# Patient Record
Sex: Female | Born: 1990 | Race: White | Hispanic: No | Marital: Single | Smoking: Never smoker
Health system: Southern US, Community
[De-identification: ages and names within clinical notes are randomized; demographics above are authoritative.]

## PROBLEM LIST (undated history)

## (undated) DIAGNOSIS — E119 Type 2 diabetes mellitus without complications: Secondary | ICD-10-CM

## (undated) DIAGNOSIS — M9110 Juvenile osteochondrosis of head of femur [Legg-Calve-Perthes], unspecified leg: Secondary | ICD-10-CM

## (undated) DIAGNOSIS — K219 Gastro-esophageal reflux disease without esophagitis: Secondary | ICD-10-CM

## (undated) HISTORY — DX: Type 2 diabetes mellitus without complications: E11.9

## (undated) HISTORY — DX: Juvenile osteochondrosis of head of femur (Legg-Calve-Perthes), unspecified leg: M91.10

## (undated) HISTORY — DX: Gastro-esophageal reflux disease without esophagitis: K21.9

---
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
## 2013-03-30 ENCOUNTER — Ambulatory Visit (INDEPENDENT_AMBULATORY_CARE_PROVIDER_SITE_OTHER): Payer: 59 | Admitting: Family Medicine

## 2013-03-30 ENCOUNTER — Encounter: Payer: Self-pay | Admitting: Family Medicine

## 2013-03-30 DIAGNOSIS — Z136 Encounter for screening for cardiovascular disorders: Secondary | ICD-10-CM

## 2013-03-30 LAB — LIPID PANEL
Cholesterol: 197 mg/dL (ref 0–200)
Triglycerides: 161 mg/dL — ABNORMAL HIGH (ref 0.0–149.0)

## 2013-03-30 LAB — COMPREHENSIVE METABOLIC PANEL
Albumin: 3.7 g/dL (ref 3.5–5.2)
BUN: 9 mg/dL (ref 6–23)
CO2: 23 mEq/L (ref 19–32)
Calcium: 8.7 mg/dL (ref 8.4–10.5)
GFR: 99.31 mL/min (ref >60.00)
Glucose, Bld: 115 mg/dL — ABNORMAL HIGH (ref 70–99)
Potassium: 4.2 mEq/L (ref 3.5–5.1)
Sodium: 136 mEq/L (ref 135–145)
Total Protein: 7.6 g/dL (ref 6.0–8.3)

## 2013-03-30 NOTE — Patient Instructions (Addendum)
Good to see you, Christina Cobb. After you go to the lab, please stop by to see Shirlee Limerick on your way out to set up your referral.

## 2013-03-30 NOTE — Progress Notes (Signed)
22 yo female new to me here to discuss obesity.  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 Since then, she continues to gain more and more weight.  She enjoys eating, loves sweets.  Lost 30 pounds once but cutting out sodas and sweets.  Does not like to exercise.    Has been doing her research and wants to get Roux En Y gastric bypass.  Adopted but no immediate family had known CAD or DM.  Patient Active Problem List   Diagnosis Date Noted  . Morbid obesity 03/30/2013   Past Medical History  Diagnosis Date  . Legg-Perthes disease    Past Surgical History  Procedure Laterality Date  . Hip surgery Left 1998    due to Legg Prethes Disease   History  Substance Use Topics  . Smoking status: Never Smoker   . Smokeless tobacco: Not on file  . Alcohol Use: Not on file   Family History  Problem Relation Age of Onset  . Adopted: Yes   No Known Allergies No current outpatient prescriptions on file prior to visit.   No current facility-administered medications on file prior to visit.   The PMH, PSH, Social History, Family History, Medications, and allergies have been reviewed in [facility], and have been updated if relevant.  ROS: See HPI  Physical exam: BP 112/78  Pulse 68  Temp(Src) 97.7 F (36.5 C)  Ht 5' 4.25" (1.632 m)  Wt 319 lb (144.697 kg)  BMI 54.33 kg/m2 Gen:  Morbidly obese, NAD Resp:  CTA bilaterally CVS:  RRR Ext:  No edema  Assessment and Plan: 1. Morbid obesity Deteriorated. Will check labs for further risk stratification. Refer to bariatric surgery (prefers [facility]). - Comprehensive metabolic panel - Lipid Panel - Hemoglobin A1c - Ambulatory referral to General Surgery  2. Screening for ischemic heart disease  - Lipid Panel

## 2013-04-15 ENCOUNTER — Telehealth: Payer: Self-pay | Admitting: *Deleted

## 2013-04-15 NOTE — Telephone Encounter (Signed)
Pt needs a letter of medical necessity and a form completed for bariatric surgery.  She doesn't have her weights for the past 5 years.  Forms are on your desk.

## 2013-04-15 NOTE — Telephone Encounter (Signed)
Letter written

## 2013-04-15 NOTE — Telephone Encounter (Signed)
Letter and forms given to pt's mother, copy sent for scanning.

## 2013-05-12 ENCOUNTER — Other Ambulatory Visit (INDEPENDENT_AMBULATORY_CARE_PROVIDER_SITE_OTHER): Payer: Self-pay

## 2013-05-12 ENCOUNTER — Ambulatory Visit (INDEPENDENT_AMBULATORY_CARE_PROVIDER_SITE_OTHER): Payer: Commercial Managed Care - PPO | Admitting: Surgery

## 2013-05-12 ENCOUNTER — Encounter (INDEPENDENT_AMBULATORY_CARE_PROVIDER_SITE_OTHER): Payer: Self-pay | Admitting: Surgery

## 2013-05-12 DIAGNOSIS — Z6841 Body Mass Index (BMI) 40.0 and over, adult: Secondary | ICD-10-CM

## 2013-05-12 NOTE — Progress Notes (Signed)
Chief Complaint:  Obesity   History of Present Illness:  Christina Cobb is an 22 y.o. female who comes in with her mother for consideration of lap gastric bypass.  Her obesity was made to really more acutely she was immobilized for the lLegg Prethes disease.  She's been on our seminars and is interested in a laparoscopic Roux-en-Y gastric bypass. I went over the procedure with her and her mother in some detail. To light to begin the workup for this because she started having some pain in her legs again and her orthopedic surgeon said that she must lose weight in order to be considered for any further surgery related to her Legg Prethes.  She has been followed by Dr. Ruthe Mannan.    Past Medical History  Diagnosis Date  . Legg-Perthes disease   . GERD (gastroesophageal reflux disease)     Past Surgical History  Procedure Laterality Date  . Hip surgery Left 1998    due to Legg Prethes Disease  . Tonsillectomy and adenoidectomy  1997    No current outpatient prescriptions on file.   No current facility-administered medications for this visit.   Review of patient's allergies indicates no known allergies. Family History  Problem Relation Age of Onset  . Adopted: Yes   Social History:   reports that she has never smoked. She has never used smokeless tobacco. She reports that she does not drink alcohol or use illicit drugs.   REVIEW OF SYSTEMS - PERTINENT POSITIVES ONLY: Negative for DVT. Has tried multiple weight loss attempts without sustaining a success  Physical Exam:   Blood pressure 136/92, pulse 84, temperature 98.2 F (36.8 C), temperature source Temporal, resp. rate 17, height 5' 4.25" (1.632 m), weight 329 lb 12.8 oz (149.596 kg). Body mass index is 56.17 kg/(m^2).  Gen:  WDWN white female NAD  Neurological: Alert and oriented to person, place, and time. Motor and sensory function is grossly intact  Head: Normocephalic and atraumatic.  Eyes: Conjunctivae are normal. Pupils  are equal, round, and reactive to light. No scleral icterus.  Neck: Normal range of motion. Neck supple. No tracheal deviation or thyromegaly present.  Cardiovascular:  SR without murmurs or gallops.  No carotid bruits Respiratory: Effort normal.  No respiratory distress. No chest wall tenderness. Breath sounds normal.  No wheezes, rales or rhonchi.  Abdomen:  Obese nontender. I described port placement to her GU: Musculoskeletal: Normal range of motion. Extremities are nontender. No cyanosis, edema or clubbing noted Lymphadenopathy: No cervical, preauricular, postauricular or axillary adenopathy is present Skin: Skin is warm and dry. No rash noted. No diaphoresis. No erythema. No pallor. Pscyh: Normal mood and affect. Behavior is normal. Judgment and thought content normal.   LABORATORY RESULTS: No results found for this or any previous visit (from the past 48 hour(s)).  RADIOLOGY RESULTS: No results found.  Problem List: Patient Active Problem List   Diagnosis Date Noted  . Morbid obesity 03/30/2013    Assessment & Plan: Morbid obesity BMI 56. We'll begin the workup journey for laparoscopic Roux-en-Y gastric bypass    Matt B. Daphine Deutscher, MD, [facility] [phone] beeper [phone]  05/12/2013 11:40 AM

## 2013-05-12 NOTE — Patient Instructions (Signed)

## 2013-05-14 ENCOUNTER — Other Ambulatory Visit (INDEPENDENT_AMBULATORY_CARE_PROVIDER_SITE_OTHER): Payer: 59

## 2013-05-14 LAB — CBC WITH DIFFERENTIAL/PLATELET
Eosinophils Absolute: 0.7 10*3/uL (ref 0.0–0.7)
Hemoglobin: 11.9 g/dL — ABNORMAL LOW (ref 12.0–15.0)
Lymphocytes Relative: 38 % (ref 12–46)
Lymphs Abs: 2.9 10*3/uL (ref 0.7–4.0)
MCH: 28.2 pg (ref 26.0–34.0)
Monocytes Relative: 8 % (ref 3–12)
Neutro Abs: 3.4 10*3/uL (ref 1.7–7.7)
Neutrophils Relative %: 45 % (ref 43–77)
Platelets: 387 10*3/uL (ref 150–400)
RBC: 4.22 MIL/uL (ref 3.87–5.11)
WBC: 7.7 10*3/uL (ref 4.0–10.5)

## 2013-05-15 LAB — TSH: TSH: 5.623 u[IU]/mL — ABNORMAL HIGH (ref 0.350–4.500)

## 2013-05-15 LAB — T4: T4, Total: 9.6 ug/dL (ref 5.0–12.5)

## 2013-05-15 LAB — LIPID PANEL: Cholesterol: 187 mg/dL (ref 0–200)

## 2013-05-15 LAB — HCG, SERUM, QUALITATIVE: Preg, Serum: NEGATIVE

## 2013-05-17 ENCOUNTER — Ambulatory Visit (HOSPITAL_COMMUNITY): Admission: RE | Admit: 2013-05-17 | Payer: 59 | Source: Ambulatory Visit | Admitting: Surgery

## 2013-05-17 LAB — H. PYLORI ANTIBODY, IGG: H Pylori IgG: <0.4 {ISR}

## 2013-05-17 SURGERY — BREATH TEST, FOR HELICOBACTER PYLORI

## 2013-06-01 ENCOUNTER — Ambulatory Visit: Payer: 59 | Admitting: Dietician

## 2013-06-14 ENCOUNTER — Ambulatory Visit (HOSPITAL_COMMUNITY): Admission: RE | Admit: 2013-06-14 | Payer: 59 | Source: Ambulatory Visit

## 2013-06-14 ENCOUNTER — Ambulatory Visit (HOSPITAL_COMMUNITY): Payer: 59

## 2013-08-12 ENCOUNTER — Other Ambulatory Visit: Payer: Self-pay

## 2014-01-31 ENCOUNTER — Encounter: Payer: Self-pay | Admitting: Internal Medicine

## 2014-01-31 ENCOUNTER — Ambulatory Visit (INDEPENDENT_AMBULATORY_CARE_PROVIDER_SITE_OTHER): Payer: 59 | Admitting: Internal Medicine

## 2014-01-31 VITALS — BP 126/90 | HR 71 | Temp 98.4°F

## 2014-01-31 DIAGNOSIS — H6092 Unspecified otitis externa, left ear: Secondary | ICD-10-CM

## 2014-01-31 DIAGNOSIS — J309 Allergic rhinitis, unspecified: Secondary | ICD-10-CM

## 2014-01-31 DIAGNOSIS — H60399 Other infective otitis externa, unspecified ear: Secondary | ICD-10-CM

## 2014-01-31 MED ORDER — CIPROFLOXACIN-DEXAMETHASONE 0.3-0.1 % OT SUSP: 4.0000 [drp] | Freq: Two times a day (BID) | OTIC | Status: DC

## 2014-01-31 MED ORDER — CETIRIZINE HCL 10 MG PO TABS: 10.0000 mg | ORAL_TABLET | Freq: Every day | ORAL | Status: DC

## 2014-01-31 NOTE — Progress Notes (Signed)
Pre visit review using our clinic review tool, if applicable. No additional management support is needed unless otherwise documented below in the visit note. 

## 2014-01-31 NOTE — Patient Instructions (Addendum)
Otitis Externa Otitis externa is a bacterial or fungal infection of the outer ear canal. This is the area from the eardrum to the outside of the ear. Otitis externa is sometimes called "swimmer's ear." CAUSES  Possible causes of infection include:  Swimming in dirty water.  Moisture remaining in the ear after swimming or bathing.  Mild injury (trauma) to the ear.  Objects stuck in the ear (foreign body).  Cuts or scrapes (abrasions) on the outside of the ear. SYMPTOMS  The first symptom of infection is often itching in the ear canal. Later signs and symptoms may include swelling and redness of the ear canal, ear pain, and yellowish-white fluid (pus) coming from the ear. The ear pain may be worse when pulling on the earlobe. DIAGNOSIS  Your caregiver will perform a physical exam. A sample of fluid may be taken from the ear and examined for bacteria or fungi. TREATMENT  Antibiotic ear drops are often given for 10 to 14 days. Treatment may also include pain medicine or corticosteroids to reduce itching and swelling. PREVENTION   Keep your ear dry. Use the corner of a towel to absorb water out of the ear canal after swimming or bathing.  Avoid scratching or putting objects inside your ear. This can damage the ear canal or remove the protective wax that lines the canal. This makes it easier for bacteria and fungi to grow.  Avoid swimming in lakes, polluted water, or poorly chlorinated pools.  You may use ear drops made of rubbing alcohol and vinegar after swimming. Combine equal parts of white vinegar and alcohol in a bottle. Put 3 or 4 drops into each ear after swimming. HOME CARE INSTRUCTIONS   Apply antibiotic ear drops to the ear canal as prescribed by your caregiver.  Only take over-the-counter or prescription medicines for pain, discomfort, or fever as directed by your caregiver.  If you have diabetes, follow any additional treatment instructions from your caregiver.  Keep all  follow-up appointments as directed by your caregiver. SEEK MEDICAL CARE IF:   You have a fever.  Your ear is still red, swollen, painful, or draining pus after 3 days.  Your redness, swelling, or pain gets worse.  You have a severe headache.  You have redness, swelling, pain, or tenderness in the area behind your ear. MAKE SURE YOU:   Understand these instructions.  Will watch your condition.  Will get help right away if you are not doing well or get worse. Document Released: 09/23/2005 Document Revised: 12/16/2011 Document Reviewed: 10/10/2011 Exit[employer] Patient Information 2014 [employer].  

## 2014-01-31 NOTE — Progress Notes (Signed)
Subjective:    Patient ID: Christina Cobb, female    DOB: 1991/06/20, 23 y.o.   MRN: [id number]  HPI  Pt presents to the clinic today with c/o left ear pain and headache. She reports this started 1 month ago. She describes the pain as sharp. It does not affect her hearing. She has noticed some white/bloody drainage our of her ear this am. She has tried OTC tylenol with minimal relief. She denies fever, chills or body aches.  She also reports that she is out of her zyrtec. She would like that refilled today.  Review of Systems      Past Medical History  Diagnosis Date  . Legg-Perthes disease   . GERD (gastroesophageal reflux disease)     No current outpatient prescriptions on file.   No current facility-administered medications for this visit.    No Known Allergies  Family History  Problem Relation Age of Onset  . Adopted: Yes    History   Social History  . Marital Status: Single    Spouse Name: N/A    Number of Children: N/A  . Years of Education: N/A   Occupational History  . Not on file.   Social History Main Topics  . Smoking status: Never Smoker   . Smokeless tobacco: Never Used  . Alcohol Use: No  . Drug Use: No  . Sexual Activity: Not on file   Other Topics Concern  . Not on file   Social History Narrative  . No narrative on file     Constitutional: Denies fever, malaise, fatigue, headache or abrupt weight changes.  HEENT: Pt reports ear pain. Denies eye pain, eye redness, ringing in the ears, wax buildup, runny nose, nasal congestion, bloody nose, or sore throat.    No other specific complaints in a complete review of systems (except as listed in HPI above).  Objective:   Physical Exam   BP 126/90  Pulse 71  Temp(Src) 98.4 F (36.9 C) (Oral)  SpO2 98% Wt Readings from Last 3 Encounters:  05/12/13 329 lb 12.8 oz (149.596 kg)  03/30/13 319 lb (144.697 kg)    General: Appears her stated age, obese but well developed, well nourished  in NAD. HEENT: Ears: Right ear, cerumen impaction. Left ear, bloody drainage along the ear canal, dull reflex of the TM, redness of the external ear and pain with palpation of the penna and tragus. Cardiovascular: Normal rate and rhythm. S1,S2 noted.  No murmur, rubs or gallops noted. No JVD or BLE edema. No carotid bruits noted. Pulmonary/Chest: Normal effort and positive vesicular breath sounds. No respiratory distress. No wheezes, rales or ronchi noted.    BMET    Component Value Date/Time   NA 136 03/30/2013 0820   K 4.2 03/30/2013 0820   CL 104 03/30/2013 0820   CO2 23 03/30/2013 0820   GLUCOSE 115* 03/30/2013 0820   BUN 9 03/30/2013 0820   CREATININE 0.8 03/30/2013 0820   CALCIUM 8.7 03/30/2013 0820    Lipid Panel     Component Value Date/Time   CHOL 187 05/12/2013 1157   TRIG 211* 05/12/2013 1157   HDL 25* 05/12/2013 1157   CHOLHDL 7.5 05/12/2013 1157   VLDL 42* 05/12/2013 1157   LDLCALC 120* 05/12/2013 1157    CBC    Component Value Date/Time   WBC 7.7 05/12/2013 1157   RBC 4.22 05/12/2013 1157   HGB 11.9* 05/12/2013 1157   HCT 35.4* 05/12/2013 1157   PLT 387 05/12/2013  1157   MCV 83.9 05/12/2013 1157   MCH 28.2 05/12/2013 1157   MCHC 33.6 05/12/2013 1157   RDW 15.6* 05/12/2013 1157   LYMPHSABS 2.9 05/12/2013 1157   MONOABS 0.6 05/12/2013 1157   EOSABS 0.7 05/12/2013 1157   BASOSABS 0.0 05/12/2013 1157    Hgb A1C Lab Results  Component Value Date   HGBA1C 6.1 03/30/2013        Assessment & Plan:   Otitis Externa, left ear:  eRx for Ciprodex- apply as directed Try ibuprofen instead of tylenol Avoid sticking anything inside the ear  Allergic Rhinitis:  eRx for zyrtec 10 mg daily  RTC as needed

## 2014-05-24 ENCOUNTER — Ambulatory Visit (INDEPENDENT_AMBULATORY_CARE_PROVIDER_SITE_OTHER): Payer: 59 | Admitting: Family Medicine

## 2014-05-24 ENCOUNTER — Encounter: Payer: Self-pay | Admitting: Family Medicine

## 2014-05-24 VITALS — BP 124/78 | HR 61 | Temp 97.7°F | Ht 64.0 in | Wt 352.8 lb

## 2014-05-24 DIAGNOSIS — N920 Excessive and frequent menstruation with regular cycle: Secondary | ICD-10-CM

## 2014-05-24 DIAGNOSIS — N921 Excessive and frequent menstruation with irregular cycle: Secondary | ICD-10-CM | POA: Insufficient documentation

## 2014-05-24 LAB — CBC WITH DIFFERENTIAL/PLATELET
BASOS ABS: 0 10*3/uL (ref 0.0–0.1)
Basophils Relative: 0.4 % (ref 0.0–3.0)
Eosinophils Absolute: 0.4 10*3/uL (ref 0.0–0.7)
Eosinophils Relative: 5.1 % — ABNORMAL HIGH (ref 0.0–5.0)
HEMATOCRIT: 32.8 % — AB (ref 36.0–46.0)
Hemoglobin: 11 g/dL — ABNORMAL LOW (ref 12.0–15.0)
LYMPHS ABS: 2.7 10*3/uL (ref 0.7–4.0)
LYMPHS PCT: 32.8 % (ref 12.0–46.0)
MCHC: 33.5 g/dL (ref 30.0–36.0)
MCV: 86 fl (ref 78.0–100.0)
MONOS PCT: 5.9 % (ref 3.0–12.0)
Monocytes Absolute: 0.5 10*3/uL (ref 0.1–1.0)
NEUTROS PCT: 55.8 % (ref 43.0–77.0)
Neutro Abs: 4.6 10*3/uL (ref 1.4–7.7)
PLATELETS: 378 10*3/uL (ref 150.0–400.0)
RBC: 3.81 Mil/uL — ABNORMAL LOW (ref 3.87–5.11)
RDW: 15 % (ref 11.5–15.5)
WBC: 8.2 10*3/uL (ref 4.0–10.5)

## 2014-05-24 MED ORDER — NORETHIN ACE-ETH ESTRAD-FE 1-20 MG-MCG(24) PO TABS: 1.0000 | ORAL_TABLET | Freq: Every day | ORAL | Status: DC

## 2014-05-24 NOTE — Progress Notes (Signed)
   Subjective:   Patient ID: Christina Cobb, female    DOB: Jun 27, 1991, 23 y.o.   MRN: [id number]  Christina LevelJessica F Dement is a pleasant 23 y.o. year old female who presents to clinic today with her mom with Dysmenorrhea  on 05/24/2014  HPI: Started her period when she was 23 yo.  Since then, periods have been irregular, heavy but only lasted 3 days.  She has been bleeding every day for past two months.  Very heavy- using several heavy pads per day. No dizziness when she stands.  Virginal.  +cramping- taking Ibuprofen which has not been very effective.  No current outpatient prescriptions on file prior to visit.   No current facility-administered medications on file prior to visit.    No Known Allergies  Past Medical History  Diagnosis Date  . Legg-Perthes disease   . GERD (gastroesophageal reflux disease)     Past Surgical History  Procedure Laterality Date  . Hip surgery Left 1998    due to Legg Prethes Disease  . Tonsillectomy and adenoidectomy  1997    Family History  Problem Relation Age of Onset  . Adopted: Yes    History   Social History  . Marital Status: Single    Spouse Name: N/A    Number of Children: N/A  . Years of Education: N/A   Occupational History  . Not on file.   Social History Main Topics  . Smoking status: Never Smoker   . Smokeless tobacco: Never Used  . Alcohol Use: No  . Drug Use: No  . Sexual Activity: Not on file   Other Topics Concern  . Not on file   Social History Narrative  . No narrative on file   The PMH, PSH, Social History, Family History, Medications, and allergies have been reviewed in [facility], and have been updated if relevant.   Review of Systems See HPI +nausea No vomiting +cramping +heavy periods No dizziness No CP or SOB    Objective:    BP 124/78  Pulse 61  Temp(Src) 97.7 F (36.5 C) (Oral)  Ht 5\' 4"  (1.626 m)  Wt 352 lb 12 oz (160.006 kg)  BMI 60.52 kg/m2  SpO2 97%   Physical Exam  Nursing note  and vitals reviewed. Skin: Skin is warm and dry.  Psychiatric: She has a normal mood and affect. Her behavior is normal. Judgment and thought content normal.          Assessment & Plan:   Menorrhagia with irregular cycle - Plan: CBC with Differential No Follow-up on file.

## 2014-05-24 NOTE — Patient Instructions (Signed)
Great to see you. Please keep me updated. I will call you with your lab results.  Take Loestrin daily.

## 2014-05-24 NOTE — Assessment & Plan Note (Signed)
New. >25 minutes spent in face to face time with patient, >50% spent in counselling or coordination of care Will start OCPs- Loestrin- explained increased risk of DVT given her BMI.  She is declining depo provera. Check CBC to rule out anemia. If symptoms persist, will order pelvic US to rule out fibroids/endometriosis or other uterine/ovarian pathology.

## 2014-05-24 NOTE — Progress Notes (Signed)
Pre visit review using our clinic review tool, if applicable. No additional management support is needed unless otherwise documented below in the visit note. 

## 2014-05-25 ENCOUNTER — Encounter: Payer: Self-pay | Admitting: *Deleted

## 2015-10-27 DIAGNOSIS — H52223 Regular astigmatism, bilateral: Secondary | ICD-10-CM | POA: Diagnosis not present

## 2015-10-27 DIAGNOSIS — H5213 Myopia, bilateral: Secondary | ICD-10-CM | POA: Diagnosis not present

## 2017-06-30 ENCOUNTER — Encounter: Payer: Self-pay | Admitting: Family Medicine

## 2017-06-30 ENCOUNTER — Ambulatory Visit (INDEPENDENT_AMBULATORY_CARE_PROVIDER_SITE_OTHER): Payer: 59 | Admitting: Family Medicine

## 2017-06-30 VITALS — BP 124/74 | HR 72 | Temp 97.9°F | Ht 64.0 in

## 2017-06-30 DIAGNOSIS — H6090 Unspecified otitis externa, unspecified ear: Secondary | ICD-10-CM | POA: Insufficient documentation

## 2017-06-30 DIAGNOSIS — H60392 Other infective otitis externa, left ear: Secondary | ICD-10-CM

## 2017-06-30 MED ORDER — NEOMYCIN-POLYMYXIN-HC 1 % OT SOLN: 3.0000 [drp] | Freq: Four times a day (QID) | OTIC | 0 refills | Status: DC

## 2017-06-30 MED FILL — NEO/POLYMYXIN/HC EAR SOLN: 3.5-10000-1 | 17 days supply | Qty: 10 | Fill #0

## 2017-06-30 NOTE — Assessment & Plan Note (Signed)
tx with neomycin-polymyxin hct 1%  3 drops in L ear four times daily while awake Warm compress Analgesics  Disc symptomatic care - see instructions on AVS  Update if not starting to improve in a week or if worsening

## 2017-06-30 NOTE — Progress Notes (Signed)
   Subjective:    Patient ID: Christina Cobb, female    DOB: November 21, 1990, 26 y.o.   MRN: [id number]  HPI Here for ear pain   Tends to get wax build up  Ear canal looked swollen  No drainage  Hurts pretty badly  No fever   No uri symptoms   No recent swimming or water in her ear   Patient Active Problem List   Diagnosis Date Noted  . Otitis externa 06/30/2017  . Menorrhagia with irregular cycle 05/24/2014  . Morbid obesity (HCC) 03/30/2013   Past Medical History:  Diagnosis Date  . GERD (gastroesophageal reflux disease)   . Legg-Perthes disease    Past Surgical History:  Procedure Laterality Date  . HIP SURGERY Left 1998   due to Legg Prethes Disease  . TONSILLECTOMY AND ADENOIDECTOMY  1997   Social History  Substance Use Topics  . Smoking status: Never Smoker  . Smokeless tobacco: Never Used  . Alcohol use No   Family History  Problem Relation Age of Onset  . Adopted: Yes   No Known Allergies No current outpatient prescriptions on file prior to visit.   No current facility-administered medications on file prior to visit.     Review of Systems  Constitutional: Negative for activity change, appetite change, fatigue, fever and unexpected weight change.  HENT: Positive for ear pain. Negative for congestion, ear discharge, facial swelling, rhinorrhea, sinus pressure and sore throat.   Eyes: Negative for pain, redness and visual disturbance.  Respiratory: Negative for cough, shortness of breath and wheezing.   Cardiovascular: Negative for chest pain and palpitations.  Gastrointestinal: Negative for abdominal pain.  Endocrine: Negative for polydipsia and polyuria.  Genitourinary: Negative for dysuria, frequency and urgency.  Musculoskeletal: Negative for arthralgias and myalgias.  Skin: Negative for pallor and rash.  Allergic/Immunologic: Negative for environmental allergies.  Neurological: Negative for dizziness, syncope and headaches.  Hematological: Negative  for adenopathy.  Psychiatric/Behavioral: Negative for decreased concentration and dysphoric mood. The patient is not nervous/anxious.        Objective:   Physical Exam  Constitutional: She appears well-developed and well-nourished. No distress.  HENT:  Head: Normocephalic and atraumatic.  Mouth/Throat: Oropharynx is clear and moist.  R ear- moderate cerumen impaction with nl hearing   L ear- swollen and injected canal/ no drainage/no cerumen  Hearing nl   Nares and throat are clear No sinus tenderness  Eyes: Pupils are equal, round, and reactive to light. Conjunctivae and EOM are normal. Right eye exhibits no discharge. Left eye exhibits no discharge. No scleral icterus.  Neck: Normal range of motion. Neck supple.  Cardiovascular: Normal rate and regular rhythm.   Pulmonary/Chest: Effort normal and breath sounds normal.  Lymphadenopathy:    She has no cervical adenopathy.  Skin: Skin is warm and dry. No rash noted. No pallor.  Psychiatric: Her affect is blunt.  Blunted affect  Mother had to do the speaking for her           Assessment & Plan:   Problem List Items Addressed This Visit      Nervous and Auditory   Otitis externa - Primary    tx with neomycin-polymyxin hct 1%  3 drops in L ear four times daily while awake Warm compress Analgesics  Disc symptomatic care - see instructions on AVS  Update if not starting to improve in a week or if worsening

## 2017-06-30 NOTE — Patient Instructions (Signed)
You have an external ear canal infection  Try a warm compress over ear if it helps  Tylenol or ibuprofen are ok for pain  Use the ear drops as directed  Update if not starting to improve in a week or if worsening

## 2017-10-01 DIAGNOSIS — H5213 Myopia, bilateral: Secondary | ICD-10-CM | POA: Diagnosis not present

## 2017-11-17 MED FILL — DUREZOL 0.05% EYE DROPS: 0.05 | 17 days supply | Qty: 5 | Fill #0

## 2017-11-17 MED FILL — GATIFLOXACIN 0.5% EYE DROPS: 0.5 | 8 days supply | Qty: 3 | Fill #0

## 2017-11-21 MED FILL — ZOLPIDEM TARTRATE 10 MG TAB: 10 | 5 days supply | Qty: 5 | Fill #0

## 2018-08-19 ENCOUNTER — Encounter: Payer: Self-pay | Admitting: Family Medicine

## 2018-08-19 ENCOUNTER — Ambulatory Visit: Payer: Self-pay | Admitting: Family Medicine

## 2018-08-19 VITALS — BP 118/76 | HR 101 | Temp 98.2°F | Ht 64.0 in | Wt 317.0 lb

## 2018-08-19 DIAGNOSIS — J029 Acute pharyngitis, unspecified: Secondary | ICD-10-CM

## 2018-08-19 DIAGNOSIS — J069 Acute upper respiratory infection, unspecified: Secondary | ICD-10-CM | POA: Insufficient documentation

## 2018-08-19 LAB — POCT RAPID STREP A (OFFICE): RAPID STREP A SCREEN: NEGATIVE

## 2018-08-19 MED ORDER — BENZONATATE 200 MG PO CAPS: 200.0000 mg | ORAL_CAPSULE | Freq: Three times a day (TID) | ORAL | 1 refills | Status: DC | PRN

## 2018-08-19 NOTE — Progress Notes (Signed)
Subjective:    Patient ID: Christina Cobb, female    DOB: 1991/02/13, 27 y.o.   MRN: [id number]  HPI 27 yo pt of Dr Dayton MartesAron here with uri symptoms   Wt Readings from Last 3 Encounters:  08/19/18 (!) 317 lb (143.8 kg)  05/24/14 (!) 352 lb 12 oz (160 kg)  05/12/13 (!) 329 lb 12.8 oz (149.6 kg)   Pulse is 101 Temp 98.2 now   Started with scratchy throat- quite sore today (that is getting worse)  No rash No swollen LN  Then cough -prod of yellow phlegm  No nasal symptoms  Non smoker  Then fever - 99.8 (low grade)  Taking mucinex otc Tylenol Cold eze   Results for orders placed or performed in visit on 08/19/18  POCT rapid strep A  Result Value Ref Range   Rapid Strep A Screen Negative Negative    Doing fairly well  In school for crime scene investigation and then wants to get masters Interested in homeland security   Patient Active Problem List   Diagnosis Date Noted  . URI with cough and congestion 08/19/2018  . Otitis externa 06/30/2017  . Menorrhagia with irregular cycle 05/24/2014  . Morbid obesity (HCC) 03/30/2013   Past Medical History:  Diagnosis Date  . GERD (gastroesophageal reflux disease)   . Legg-Perthes disease    Past Surgical History:  Procedure Laterality Date  . HIP SURGERY Left 1998   due to Legg Prethes Disease  . TONSILLECTOMY AND ADENOIDECTOMY  1997   Social History   Tobacco Use  . Smoking status: Never Smoker  . Smokeless tobacco: Never Used  Substance Use Topics  . Alcohol use: No  . Drug use: No   Family History  Adopted: Yes   No Known Allergies No current outpatient medications on file prior to visit.   No current facility-administered medications on file prior to visit.     Review of Systems  Constitutional: Positive for appetite change and fatigue. Negative for fever.  HENT: Positive for postnasal drip and sore throat. Negative for congestion, ear pain, rhinorrhea, sinus pressure, sneezing and voice change.   Eyes:  Negative for pain and discharge.  Respiratory: Positive for cough. Negative for chest tightness, shortness of breath, wheezing and stridor.   Cardiovascular: Negative for chest pain.  Gastrointestinal: Negative for diarrhea, nausea and vomiting.  Genitourinary: Negative for frequency, hematuria and urgency.  Musculoskeletal: Negative for arthralgias and myalgias.  Skin: Negative for rash.  Neurological: Positive for headaches. Negative for dizziness, weakness and light-headedness.  Psychiatric/Behavioral: Negative for confusion and dysphoric mood.       Objective:   Physical Exam  Constitutional: She appears well-developed and well-nourished. No distress.  HENT:  Head: Normocephalic and atraumatic.  Right Ear: External ear normal.  Left Ear: External ear normal.  Mouth/Throat: Oropharynx is clear and moist.  Nares are injected and congested (despite lack of nasal symptoms)  No sinus tenderness Clear rhinorrhea and post nasal drip  Mild post throat injection   Eyes: Pupils are equal, round, and reactive to light. Conjunctivae and EOM are normal. Right eye exhibits no discharge. Left eye exhibits no discharge.  Neck: Normal range of motion. Neck supple.  Cardiovascular: Normal rate and normal heart sounds.  Pulmonary/Chest: Effort normal and breath sounds normal. No stridor. No respiratory distress. She has no wheezes. She has no rales. She exhibits no tenderness.  Good air exch No rales /rhonchi No wheeze   Lymphadenopathy:    She has  no cervical adenopathy.  Neurological: She is alert.  Skin: Skin is warm and dry. No rash noted.  Psychiatric: She has a normal mood and affect.  Pleasant           Assessment & Plan:   Problem List Items Addressed This Visit      Respiratory   URI with cough and congestion - Primary    With low grade temp and significant ST RST neg  Reassuring exam  Disc symptomatic care - see instructions on AVS  mucinex dm or mucinex plain plus  delsym Analgesic Antihistamine Handout given  Sent in tessalon for addnl cough control -prn  Update if not starting to improve in a week or if worsening         Other Visit Diagnoses    Sore throat       Relevant Orders   POCT rapid strep A (Completed)

## 2018-08-19 NOTE — Patient Instructions (Addendum)
I recommend mucinex DM for cough over the counter (there is a "max" version that may work better  If you have plain mucinex (not DM)- you can add delsym cough medicine (that has DM in it)  Salt water gargles  Tylenol or ibuprofen for sore throat and fever  Lots of rest  Strep test is negative today - so I think this is an upper respiratory virus  You may develop runny/stuffy nose   An antihistamine may help cough and also nasal symptoms if you get them   Wash your hands frequently -you are contagious   Update if not starting to improve in a week or if worsening

## 2018-08-19 NOTE — Assessment & Plan Note (Signed)
With low grade temp and significant ST RST neg  Reassuring exam  Disc symptomatic care - see instructions on AVS  mucinex dm or mucinex plain plus delsym Analgesic Antihistamine Handout given  Sent in tessalon for addnl cough control -prn  Update if not starting to improve in a week or if worsening

## 2018-09-08 ENCOUNTER — Telehealth: Payer: Self-pay | Admitting: Family Medicine

## 2018-09-08 NOTE — Telephone Encounter (Signed)
Patient was feeling better with symptoms, but cough remained: 12/2 began with sore throat again, no fever, no headache or dizziness.  Still doesn't have insurance.  Please advise.  [facility]

## 2018-09-08 NOTE — Telephone Encounter (Signed)
Mother notified and she will keep doing all the OTC recommendation and update us if she doesn't improve

## 2018-09-08 NOTE — Telephone Encounter (Signed)
The cough is usually the last thing to get better-if she needs a refill of tessalon please send it  Also mucinex DM or delsym otc is helpful   If ST is severe or fever -please f/u  Tylenol or ibuprofen may help  ? If ST may be from the trauma of coughing  Salt water gargles and chloraseptic throat spray are also helpful  Watch for fever/ worse prod cough/facial pain /severe ST and keep me updated

## 2020-02-19 ENCOUNTER — Ambulatory Visit: Payer: Self-pay | Attending: Internal Medicine

## 2020-02-19 DIAGNOSIS — Z23 Encounter for immunization: Secondary | ICD-10-CM

## 2020-02-19 NOTE — Progress Notes (Signed)
   Covid-19 Vaccination Clinic  Name:  NYLEE BARBUTO    MRN: [id number] DOB: October 01, 1991  02/19/2020  Ms. Newcom was observed post Covid-19 immunization for 15 minutes without incident. She was provided with Vaccine Information Sheet and instruction to access the V-Safe system.   Ms. Cavanaugh was instructed to call 911 with any severe reactions post vaccine: Marland Kitchen Difficulty breathing  . Swelling of face and throat  . A fast heartbeat  . A bad rash all over body  . Dizziness and weakness   Immunizations Administered    Name Date Dose VIS Date Route   Pfizer COVID-19 Vaccine 02/19/2020  9:43 AM 0.3 mL 12/01/2018 Intramuscular   Manufacturer: [employer], [employer]   Lot: [id number]   NDC: [address]-7

## 2020-03-11 ENCOUNTER — Ambulatory Visit: Payer: Self-pay | Attending: Internal Medicine

## 2020-03-11 DIAGNOSIS — Z23 Encounter for immunization: Secondary | ICD-10-CM

## 2020-03-11 NOTE — Progress Notes (Signed)
   Covid-19 Vaccination Clinic  Name:  LAKEYN DOKKEN    MRN: [id number] DOB: 1991-08-14  03/11/2020  Ms. Blas was observed post Covid-19 immunization for 15 minutes without incident. She was provided with Vaccine Information Sheet and instruction to access the V-Safe system.   Ms. Fleagle was instructed to call 911 with any severe reactions post vaccine: Marland Kitchen Difficulty breathing  . Swelling of face and throat  . A fast heartbeat  . A bad rash all over body  . Dizziness and weakness   Immunizations Administered    Name Date Dose VIS Date Route   Pfizer COVID-19 Vaccine 03/11/2020  8:35 AM 0.3 mL 12/01/2018 Intramuscular   Manufacturer: [employer], [employer]   Lot: [id number]   NDC: [address]-0

## 2020-03-14 ENCOUNTER — Ambulatory Visit: Payer: Self-pay

## 2020-03-29 ENCOUNTER — Telehealth: Payer: Self-pay | Admitting: General Practice

## 2020-03-29 NOTE — Telephone Encounter (Signed)
Removed Dr. Aron as patient's PCP due to her leaving this practice and patient has not been seen by her in 6 years.   

## 2021-03-12 ENCOUNTER — Other Ambulatory Visit: Payer: Self-pay | Admitting: Family Medicine

## 2021-03-12 ENCOUNTER — Encounter: Payer: Self-pay | Admitting: Family Medicine

## 2021-03-12 ENCOUNTER — Telehealth (INDEPENDENT_AMBULATORY_CARE_PROVIDER_SITE_OTHER): Payer: BC Managed Care – PPO | Admitting: Family Medicine

## 2021-03-12 VITALS — HR 88 | Temp 99.8°F

## 2021-03-12 DIAGNOSIS — J069 Acute upper respiratory infection, unspecified: Secondary | ICD-10-CM

## 2021-03-12 DIAGNOSIS — J0110 Acute frontal sinusitis, unspecified: Secondary | ICD-10-CM

## 2021-03-12 DIAGNOSIS — J019 Acute sinusitis, unspecified: Secondary | ICD-10-CM | POA: Insufficient documentation

## 2021-03-12 MED ORDER — AMOXICILLIN-POT CLAVULANATE 875-125 MG PO TABS: 1.0000 | ORAL_TABLET | Freq: Two times a day (BID) | ORAL | 0 refills | Status: DC

## 2021-03-12 MED ORDER — PROMETHAZINE-DM 6.25-15 MG/5ML PO SYRP: 5.0000 mL | ORAL_SOLUTION | Freq: Four times a day (QID) | ORAL | 0 refills | Status: DC | PRN

## 2021-03-12 NOTE — Progress Notes (Signed)
Virtual Visit via Video Note  I connected with [name] on 03/12/21 at  4:15 PM EDT by a video enabled telemedicine application and verified that I am speaking with the correct person using two identifiers.  Location: Patient: home Provider: office   I discussed the limitations of evaluation and management by telemedicine and the availability of in person appointments. The patient expressed understanding and agreed to proceed.  Parties involved in encounter  Patient: Christina Cobb  Provider:  Roxy Manns MD   History of Present Illness: Pt presents for uri symptoms -needing work note   Started Friday evening with ear pain L  Then sinus pain and sore throat  Congestion   A little cough- just from drainage since Saturday  Then fever as well (went up to 101.2 several times  Some body aches Chills once   Green nasal d/c -nasty color  Sinus pain all around eyes  Also her upper jaw/teeth  L ear still hurts too (used some otc drops)    No wheezing or sob  No n/v/d   She ordered a test from [facility] - it was negative   Temp 99.8   otc mucinex DM Zyrtec   Is immunized for covid, no booster   Patient Active Problem List   Diagnosis Date Noted  . URI with cough and congestion 08/19/2018  . Menorrhagia with irregular cycle 05/24/2014  . Morbid obesity (HCC) 03/30/2013   Past Medical History:  Diagnosis Date  . GERD (gastroesophageal reflux disease)   . Legg-Perthes disease    Past Surgical History:  Procedure Laterality Date  . HIP SURGERY Left 1998   due to Legg Prethes Disease  . TONSILLECTOMY AND ADENOIDECTOMY  1997   Social History   Tobacco Use  . Smoking status: Never Smoker  . Smokeless tobacco: Never Used  Substance Use Topics  . Alcohol use: No  . Drug use: No   Family History  Adopted: Yes   No Known Allergies No current outpatient medications on file prior to visit.   No current facility-administered medications on file prior to  visit.   Review of Systems  Constitutional: Positive for fever and malaise/fatigue. Negative for chills.  HENT: Positive for congestion, ear pain, sinus pain and sore throat.   Eyes: Negative for blurred vision, discharge and redness.  Respiratory: Positive for cough. Negative for sputum production, shortness of breath, wheezing and stridor.   Cardiovascular: Negative for chest pain, palpitations and leg swelling.  Gastrointestinal: Negative for abdominal pain, diarrhea, nausea and vomiting.  Musculoskeletal: Negative for myalgias.  Skin: Negative for rash.  Neurological: Positive for headaches. Negative for dizziness.    Observations/Objective: Patient appears well, in no distress Weight is baseline  No facial swelling or asymmetry Voice is hoarse No obvious tremor or mobility impairment Moving neck and UEs normally Able to hear the call well  Audible occ dry cough No audible wheeze or sob  Talkative and mentally sharp with no cognitive changes No skin changes on face or neck , no rash or pallor Affect is normal    Assessment and Plan: Problem List Items Addressed This Visit      Respiratory   URI with cough and congestion - Primary    Home covid test negative PCR test ordered to confirm, along with flu test  Disc symptom control  Tylenol prn pain/fever prometh-dm called in for cough  Rest/fluids Watch closely for sob or wheeze ER parameters disc  Work note-to be out until test results  return /symptoms are better      Acute sinusitis    With viral uri  Purulent sinus drainage and facial pain/L ear pain that is worsening  Will tx with augmentin  Consider prednisone if no improvement  Update if not starting to improve in a week or if worsening         Relevant Medications   amoxicillin-clavulanate (AUGMENTIN) 875-125 MG tablet   promethazine-dextromethorphan (PROMETHAZINE-DM) 6.25-15 MG/5ML syrup      Follow Up Instructions: Drink fluids and rest  Try the  promethazine DM for cough and congestion  Nasal saline for congestion as needed  Tylenol for fever or pain or headache  Take augmentin for sinus infection  Please alert [place] if symptoms worsen (if severe or short of breath please go to the ER)   The office will call to set up testing for covid and flu  Continue to isolate until we get test results and you feel better    I discussed the assessment and treatment plan with the patient. The patient was provided an opportunity to ask questions and all were answered. The patient agreed with the plan and demonstrated an understanding of the instructions.   The patient was advised to call back or seek an in-person evaluation if the symptoms worsen or if the condition fails to improve as anticipated.    Roxy Manns, MD

## 2021-03-12 NOTE — Patient Instructions (Signed)
Drink fluids and rest  Try the promethazine DM for cough and congestion  Nasal saline for congestion as needed  Tylenol for fever or pain or headache  Take augmentin for sinus infection  Please alert [place] if symptoms worsen (if severe or short of breath please go to the ER)   The office will call to set up testing for covid and flu  Continue to isolate until we get test results and you feel better

## 2021-03-12 NOTE — Assessment & Plan Note (Signed)
Home covid test negative PCR test ordered to confirm, along with flu test  Disc symptom control  Tylenol prn pain/fever prometh-dm called in for cough  Rest/fluids Watch closely for sob or wheeze ER parameters disc  Work note-to be out until test results return /symptoms are better

## 2021-03-12 NOTE — Assessment & Plan Note (Signed)
With viral uri  Purulent sinus drainage and facial pain/L ear pain that is worsening  Will tx with augmentin  Consider prednisone if no improvement  Update if not starting to improve in a week or if worsening

## 2021-03-13 ENCOUNTER — Other Ambulatory Visit (INDEPENDENT_AMBULATORY_CARE_PROVIDER_SITE_OTHER): Payer: BC Managed Care – PPO

## 2021-03-13 DIAGNOSIS — J069 Acute upper respiratory infection, unspecified: Secondary | ICD-10-CM | POA: Diagnosis not present

## 2021-03-13 LAB — POC INFLUENZA A&B (BINAX/QUICKVUE)
Influenza A, POC: NEGATIVE
Influenza B, POC: NEGATIVE

## 2021-03-14 ENCOUNTER — Telehealth: Payer: Self-pay | Admitting: Family Medicine

## 2021-03-14 LAB — SARS-COV-2, NAA 2 DAY TAT

## 2021-03-14 LAB — NOVEL CORONAVIRUS, NAA: SARS-CoV-2, NAA: NOT DETECTED

## 2021-03-14 NOTE — Telephone Encounter (Signed)
Work note

## 2021-03-21 ENCOUNTER — Telehealth: Payer: Self-pay | Admitting: *Deleted

## 2021-03-21 MED ORDER — FLUCONAZOLE 150 MG PO TABS: ORAL_TABLET | ORAL | 0 refills | Status: DC

## 2021-03-21 NOTE — Telephone Encounter (Signed)
I sent it  F/u if no improvement  

## 2021-03-21 NOTE — Telephone Encounter (Signed)
Mother Artelia Laroche) asked that I send Dr. Milinda Antis a message. Pt has completed abx and is feeling better but has developed a yeast inf. She doesn't have any vaginal discharge that she can see but she is having increased vaginal and perineal itching that is getting worse.   Mother request 2 pills of diflucan because sometimes pt has to take the 2nd dose 3 days later to completely clear up yeast inf.  [facility] [address]

## 2021-03-21 NOTE — Telephone Encounter (Signed)
Pt's mother notified Rx sent

## 2021-03-29 ENCOUNTER — Telehealth: Payer: Self-pay

## 2021-03-29 NOTE — Telephone Encounter (Signed)
Done and in IN box 

## 2021-03-29 NOTE — Telephone Encounter (Signed)
Semi completed FMLA paperwork placed in Dr. Royden Purl inbox for review, completion, sign and date

## 2021-03-29 NOTE — Telephone Encounter (Signed)
Paperwork completed, signed and faxed  Copy for pt  Copy for scan   Copy retained by me

## 2021-08-06 ENCOUNTER — Encounter: Payer: Self-pay | Admitting: Internal Medicine

## 2021-08-06 ENCOUNTER — Telehealth (INDEPENDENT_AMBULATORY_CARE_PROVIDER_SITE_OTHER): Payer: BC Managed Care – PPO | Admitting: Internal Medicine

## 2021-08-06 ENCOUNTER — Other Ambulatory Visit: Payer: Self-pay

## 2021-08-06 DIAGNOSIS — J0110 Acute frontal sinusitis, unspecified: Secondary | ICD-10-CM | POA: Diagnosis not present

## 2021-08-06 MED ORDER — AMOXICILLIN-POT CLAVULANATE 875-125 MG PO TABS: 1.0000 | ORAL_TABLET | Freq: Two times a day (BID) | ORAL | 0 refills | Status: DC

## 2021-08-06 MED ORDER — PROMETHAZINE-DM 6.25-15 MG/5ML PO SYRP: 5.0000 mL | ORAL_SOLUTION | Freq: Four times a day (QID) | ORAL | 0 refills | Status: DC | PRN

## 2021-08-06 NOTE — Progress Notes (Signed)
   Subjective:    Patient ID: Christina Cobb, female    DOB: 18-Aug-1991, 30 y.o.   MRN: [id number]  HPI Video virtual visit for respiratory symptoms Identification done Reviewed limitations and billing and she gave consent Participants--- patient in her home (and mom RN in background helps a little) and I am in my office  Started ~10 days ago---then worsened 2 days later Sore throat, mild cough--head and then chest congestion --with SOB Some improvement   Went back to work 4 days ago---thought she was improving but then got SOB, worse cough and chest congestion Worked 2 more days but worsened Now with some ear pain--and sense of it closing up (right)  Taking the rest of the cough medication she had and robitussin Comes back when it wears off Green brown mucus from nose--and also when she coughs  Hasn't check oximetry Has had fever---highest was 101.2 Notes the SOB with a coughing fit----some SOB even with just sitting  Current Outpatient Medications on File Prior to Visit  Medication Sig Dispense Refill   promethazine-dextromethorphan (PROMETHAZINE-DM) 6.25-15 MG/5ML syrup Take 5 mLs by mouth 4 (four) times daily as needed for cough. Caution of sedation 118 mL 0   No current facility-administered medications on file prior to visit.    No Known Allergies  Past Medical History:  Diagnosis Date   GERD (gastroesophageal reflux disease)    Legg-Perthes disease     Past Surgical History:  Procedure Laterality Date   HIP SURGERY Left 1998   due to Legg Prethes Disease   TONSILLECTOMY AND ADENOIDECTOMY  1997    Family History  Adopted: Yes    Social History   Socioeconomic History   Marital status: Single    Spouse name: Not on file   Number of children: Not on file   Years of education: Not on file   Highest education level: Not on file  Occupational History   Not on file  Tobacco Use   Smoking status: Never   Smokeless tobacco: Never  Substance and Sexual  Activity   Alcohol use: No   Drug use: No   Sexual activity: Not on file  Other Topics Concern   Not on file  Social History Narrative   Not on file   Social Determinants of Health   Financial Resource Strain: Not on file  Food Insecurity: Not on file  Transportation Needs: Not on file  Physical Activity: Not on file  Stress: Not on file  Social Connections: Not on file  Intimate Partner Violence: Not on file   Review of Systems Some dizziness this morning---vertigo No headaches No N/V Eating okay--appetite is off Cough keeping her up Tested for COVID twice--both negative Lives with mom---she did miss work today due to congestion/scratchy throat     Objective:   Physical Exam Constitutional:      Appearance: Normal appearance.  Pulmonary:     Effort: Pulmonary effort is normal. No respiratory distress.     Comments: No distress or dyspnea sitting on her couch Neurological:     Mental Status: She is alert.           Assessment & Plan:

## 2021-08-06 NOTE — Assessment & Plan Note (Addendum)
Seems to have at least a sinus infection----I have some concern about the dyspnea Will try augmentin --and may need in person visit within 48 hours if not improving Will refill cough syrup Continue analgesics  May need FMLA papers for 4 days of missed work

## 2021-08-13 ENCOUNTER — Encounter: Payer: Self-pay | Admitting: Internal Medicine

## 2021-08-13 ENCOUNTER — Ambulatory Visit (INDEPENDENT_AMBULATORY_CARE_PROVIDER_SITE_OTHER): Payer: BC Managed Care – PPO | Admitting: Internal Medicine

## 2021-08-13 ENCOUNTER — Other Ambulatory Visit: Payer: Self-pay

## 2021-08-13 DIAGNOSIS — H6501 Acute serous otitis media, right ear: Secondary | ICD-10-CM | POA: Insufficient documentation

## 2021-08-13 MED ORDER — PREDNISONE 20 MG PO TABS: 40.0000 mg | ORAL_TABLET | Freq: Every day | ORAL | 0 refills | Status: DC

## 2021-08-13 NOTE — Progress Notes (Signed)
   Subjective:    Patient ID: Christina Cobb, female    DOB: [birth year], 30 y.o.   MRN: [id number]  HPI Here due to continued ear pain This visit occurred during the SARS-CoV-2 public health emergency.  Safety protocols were in place, including screening questions prior to the visit, additional usage of staff PPE, and extensive cleaning of exam room while observing appropriate contact time as indicated for disinfecting solutions.   Feels some better--1 dose left Still having a lot of right ear pain--and also her right throat Some sporadic cough--but is better "I feel like I am listening to a straw in that ear" No SOB  Current Outpatient Medications on File Prior to Visit  Medication Sig Dispense Refill   amoxicillin-clavulanate (AUGMENTIN) 875-125 MG tablet Take 1 tablet by mouth 2 (two) times daily. 14 tablet 0   promethazine-dextromethorphan (PROMETHAZINE-DM) 6.25-15 MG/5ML syrup Take 5 mLs by mouth 4 (four) times daily as needed for cough. Caution of sedation 118 mL 0   No current facility-administered medications on file prior to visit.    No Known Allergies  Past Medical History:  Diagnosis Date   GERD (gastroesophageal reflux disease)    Legg-Perthes disease     Past Surgical History:  Procedure Laterality Date   HIP SURGERY Left 1998   due to Legg Prethes Disease   TONSILLECTOMY AND ADENOIDECTOMY  1997    Family History  Adopted: Yes    Social History   Socioeconomic History   Marital status: Single    Spouse name: Not on file   Number of children: Not on file   Years of education: Not on file   Highest education level: Not on file  Occupational History   Not on file  Tobacco Use   Smoking status: Never   Smokeless tobacco: Never  Substance and Sexual Activity   Alcohol use: No   Drug use: No   Sexual activity: Not on file  Other Topics Concern   Not on file  Social History Narrative   Not on file   Social Determinants of Health   Financial  Resource Strain: Not on file  Food Insecurity: Not on file  Transportation Needs: Not on file  Physical Activity: Not on file  Stress: Not on file  Social Connections: Not on file  Intimate Partner Violence: Not on file   Review of Systems Uses pillow over right ear to ease pain---able to slee Appetite remains normal No N/V No diarrhea    Objective:   Physical Exam Constitutional:      Appearance: Normal appearance.  HENT:     Right Ear: Ear canal normal.     Left Ear: Ear canal normal.     Ears:     Comments: Left TM slightly retracted  No tragal tenderness on right TM slightly prominent (?fluid) No inflammation    Nose:     Comments: Mild pale nasal congestion    Mouth/Throat:     Pharynx: No oropharyngeal exudate or posterior oropharyngeal erythema.  Pulmonary:     Effort: Pulmonary effort is normal.     Breath sounds: Normal breath sounds. No wheezing or rales.  Musculoskeletal:     Cervical back: Neck supple.  Lymphadenopathy:     Cervical: No cervical adenopathy.  Neurological:     Mental Status: She is alert.           Assessment & Plan:

## 2021-08-13 NOTE — Assessment & Plan Note (Signed)
No redness but substantial pain Finishing the augmentin and mostly better overall Will try 3 days of prednisone FMLA papers done

## 2021-08-21 ENCOUNTER — Other Ambulatory Visit: Payer: Self-pay

## 2021-08-21 ENCOUNTER — Telehealth: Payer: Self-pay

## 2021-08-21 MED ORDER — DOXYCYCLINE HYCLATE 100 MG PO TABS
100.0000 mg | ORAL_TABLET | Freq: Two times a day (BID) | ORAL | 0 refills | Status: DC
  Filled 2021-08-21: qty 14, 7d supply, fill #0

## 2021-08-21 NOTE — Telephone Encounter (Signed)
Pt's mom called to say her symptoms have worsened in the last day: Right Ear Pain, sore throat, productive cough with yellow phlegm, and a negative covid test. Asking what she should do.

## 2021-08-21 NOTE — Telephone Encounter (Signed)
Patient's mom notified in person as instructed and she verbalized understanding.

## 2021-08-21 NOTE — Telephone Encounter (Signed)
Let them know I sent a different antibiotic to the [facility] pharmacy

## 2022-02-19 ENCOUNTER — Ambulatory Visit (INDEPENDENT_AMBULATORY_CARE_PROVIDER_SITE_OTHER)
Admission: RE | Admit: 2022-02-19 | Discharge: 2022-02-19 | Disposition: A | Discharge status: Home / Self Care | Payer: BC Managed Care – PPO | Source: Ambulatory Visit | Attending: Family | Admitting: Family

## 2022-02-19 ENCOUNTER — Other Ambulatory Visit: Payer: Self-pay

## 2022-02-19 ENCOUNTER — Other Ambulatory Visit: Payer: Self-pay | Admitting: Family

## 2022-02-19 ENCOUNTER — Ambulatory Visit: Payer: BC Managed Care – PPO | Admitting: Family

## 2022-02-19 ENCOUNTER — Encounter: Payer: Self-pay | Admitting: Family

## 2022-02-19 VITALS — BP 108/78 | HR 88 | Temp 98.7°F | Resp 16 | Ht 64.0 in | Wt 309.0 lb

## 2022-02-19 DIAGNOSIS — R5382 Chronic fatigue, unspecified: Secondary | ICD-10-CM | POA: Insufficient documentation

## 2022-02-19 DIAGNOSIS — M9112 Juvenile osteochondrosis of head of femur [Legg-Calve-Perthes], left leg: Secondary | ICD-10-CM

## 2022-02-19 DIAGNOSIS — Z23 Encounter for immunization: Secondary | ICD-10-CM | POA: Insufficient documentation

## 2022-02-19 DIAGNOSIS — F901 Attention-deficit hyperactivity disorder, predominantly hyperactive type: Secondary | ICD-10-CM

## 2022-02-19 DIAGNOSIS — M25552 Pain in left hip: Secondary | ICD-10-CM

## 2022-02-19 DIAGNOSIS — D649 Anemia, unspecified: Secondary | ICD-10-CM

## 2022-02-19 DIAGNOSIS — R946 Abnormal results of thyroid function studies: Secondary | ICD-10-CM

## 2022-02-19 DIAGNOSIS — E782 Mixed hyperlipidemia: Secondary | ICD-10-CM | POA: Diagnosis not present

## 2022-02-19 DIAGNOSIS — R739 Hyperglycemia, unspecified: Secondary | ICD-10-CM | POA: Insufficient documentation

## 2022-02-19 MED ORDER — TRAMADOL HCL 50 MG PO TABS
50.0000 mg | ORAL_TABLET | Freq: Four times a day (QID) | ORAL | 0 refills | Status: AC | PRN
  Filled 2022-02-19: qty 20, 5d supply, fill #0

## 2022-02-19 NOTE — Progress Notes (Signed)
Established Patient Office Visit  Subjective:  Patient ID: Christina Cobb, female    DOB: Feb 19, 1991  Age: 31 y.o. MRN: [id number]  CC:  Chief Complaint  Patient presents with   Transitions Of Care    HPI Christina Cobb is here for a transition of care visit.  She is adopted at 29 old.  When she 31 years old while sitting [place], was brought to pediatrician and sent for xray and was dx with legg calve perthes disease. From there, went to pediatric orthopedist and surgery was done at the time, might have surgeries in the future but has not had to have it. She does work at [employer] and constantly on her foot on the cement floor and now having left hip pain, that is causing her to limp. Hard for her to stand at work, works at [employer] and constantly on her feet. Sometimes she needs to call out of work due to recent increase in hip pain requiring her to stay at home lying down and taking medication for pain prn.  Tried to get appt with Dr. Berenice Primas, but required a referral to be seen.   Left hip pain that radiates down her anterior thigh, has been going on for 1 to 1.5 years.   Prior provider was: Dr. Ames Coupe  Pt is without acute concerns.   Menses regular, monthly. Not overly heavy.   TDAP: has not had in over ten years.  Pap : virgin, is not sexually active.    Does struggle with weight. Hard to lose weight as well. Always feels warm.  Lab Results  Component Value Date   TSH 1.12 02/19/2022     chronic concerns:  ADHD: used to take medication in the past for this. Gets restless jittery, uses fidget toys to keep her concentrating. Works at [employer]. Tried adderall in the past but didn't like how it made her feel.   Past Medical History:  Diagnosis Date   GERD (gastroesophageal reflux disease)    Legg-Perthes disease     Past Surgical History:  Procedure Laterality Date   HIP SURGERY Left 1998   due to [place] Disease   TONSILLECTOMY AND  ADENOIDECTOMY  1997    Family History  Adopted: Yes    Social History   Socioeconomic History   Marital status: Single    Spouse name: Not on file   Number of children: 0   Years of education: Not on file   Highest education level: Not on file  Occupational History   Occupation: [employer]  Tobacco Use   Smoking status: Never   Smokeless tobacco: Never  Vaping Use   Vaping Use: Never used  Substance and Sexual Activity   Alcohol use: No   Drug use: No   Sexual activity: Never  Other Topics Concern   Not on file  Social History Narrative   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    Social Determinants of Health   Financial Resource Strain: Not on file  Food Insecurity: Not on file  Transportation Needs: Not on file  Physical Activity: Not on file  Stress: Not on file  Social Connections: Not on file  Intimate Partner Violence: Not on file    Outpatient Medications Prior to Visit  Medication Sig Dispense Refill   doxycycline (VIBRA-TABS) 100 MG tablet Take 1 tablet (100 mg total) by mouth 2 (two) times daily. 14 tablet 0   predniSONE (DELTASONE) 20 MG tablet Take 2 tablets (  40 mg total) by mouth daily. 6 tablet 0   promethazine-dextromethorphan (PROMETHAZINE-DM) 6.25-15 MG/5ML syrup Take 5 mLs by mouth 4 (four) times daily as needed for cough. Caution of sedation 118 mL 0   No facility-administered medications prior to visit.    No Known Allergies  ROS Review of Systems  Constitutional:  Negative for chills, fatigue and unexpected weight change (struggles to lose weight).  Respiratory:  Negative for cough and shortness of breath.   Cardiovascular:  Negative for chest pain and leg swelling.  Gastrointestinal:  Negative for diarrhea and nausea.  Genitourinary:  Negative for difficulty urinating, menstrual problem and vaginal bleeding.  Musculoskeletal:  Positive for arthralgias (left hip pain, harder to walk. painful ROM).  Neurological:   Negative for dizziness and headaches.  Psychiatric/Behavioral:  Negative for agitation and sleep disturbance.   All other systems reviewed and are negative.  Review of Systems  Respiratory:  Negative for shortness of breath.   Cardiovascular:  Negative for chest pain and palpitations.  Gastrointestinal:  Negative for constipation and diarrhea.  Genitourinary:  Negative for dysuria, frequency and urgency.  Musculoskeletal:  Negative for myalgias.  Psychiatric/Behavioral:  Negative for depression and suicidal ideas.   All other systems reviewed and are negative.    Objective:    Physical Exam Vitals reviewed.  Constitutional:      General: She is not in acute distress.    Appearance: Normal appearance. She is obese. She is not ill-appearing or toxic-appearing.  HENT:     Right Ear: Tympanic membrane normal.     Left Ear: Tympanic membrane normal.     Mouth/Throat:     Mouth: Mucous membranes are moist.     Pharynx: No pharyngeal swelling.     Tonsils: No tonsillar exudate.  Eyes:     Extraocular Movements: Extraocular movements intact.     Conjunctiva/sclera: Conjunctivae normal.     Pupils: Pupils are equal, round, and reactive to light.  Neck:     Thyroid: No thyroid mass.  Cardiovascular:     Rate and Rhythm: Normal rate and regular rhythm.  Pulmonary:     Effort: Pulmonary effort is normal.     Breath sounds: Normal breath sounds.  Abdominal:     General: Abdomen is flat. Bowel sounds are normal.     Palpations: Abdomen is soft.  Musculoskeletal:        General: Tenderness (left hip) present.     Left hip: Tenderness present. Decreased range of motion.  Lymphadenopathy:     Cervical:     Right cervical: No superficial cervical adenopathy.    Left cervical: No superficial cervical adenopathy.  Skin:    General: Skin is warm.     Capillary Refill: Capillary refill takes less than 2 seconds.  Neurological:     General: No focal deficit present.     Mental Status:  She is alert and oriented to person, place, and time.  Psychiatric:        Mood and Affect: Mood normal.        Behavior: Behavior normal.        Thought Content: Thought content normal.        Judgment: Judgment normal.     BP 108/78   Pulse 88   Temp 98.7 F (37.1 C)   Resp 16   Ht 5\' 4"  (1.626 m)   Wt (!) 309 lb (140.2 kg)   LMP 01/20/2022 (Approximate)   SpO2 97%   BMI 53.04 kg/m  Wt Readings from Last 3 Encounters:  02/19/22 (!) 309 lb (140.2 kg)  08/13/21 (!) 301 lb (136.5 kg)  08/19/18 (!) 317 lb (143.8 kg)     Health Maintenance Due  Topic Date Due   URINE MICROALBUMIN  Never done   HIV Screening  Never done   PAP SMEAR-Modifier  Never done    There are no preventive care reminders to display for this patient.  Lab Results  Component Value Date   TSH 1.12 02/19/2022   Lab Results  Component Value Date   WBC 7.2 02/19/2022   HGB 13.3 02/19/2022   HCT 40.0 02/19/2022   MCV 86.5 02/19/2022   PLT 339.0 02/19/2022   Lab Results  Component Value Date   NA 133 (L) 02/19/2022   K 4.4 02/19/2022   CO2 25 02/19/2022   GLUCOSE 305 (H) 02/19/2022   BUN 8 02/19/2022   CREATININE 0.59 02/19/2022   BILITOT 0.4 02/19/2022   ALKPHOS 128 (H) 02/19/2022   AST 27 02/19/2022   ALT 46 (H) 02/19/2022   PROT 7.0 02/19/2022   ALBUMIN 4.0 02/19/2022   CALCIUM 9.0 02/19/2022   GFR 121.58 02/19/2022   Lab Results  Component Value Date   CHOL 220 (H) 02/19/2022   Lab Results  Component Value Date   HDL 28.40 (L) 02/19/2022   Lab Results  Component Value Date   LDLCALC 120 (H) 05/12/2013   Lab Results  Component Value Date   TRIG (H) 02/19/2022    469.0 Triglyceride is over 400; calculations on Lipids are invalid.   Lab Results  Component Value Date   CHOLHDL 8 02/19/2022   Lab Results  Component Value Date   HGBA1C 11.5 (H) 02/19/2022      Assessment & Plan:   Problem List Items Addressed This Visit       Musculoskeletal and Integument    Legg-Calve-Perthes disease, left - Primary    Referral to orthopedist Xray left hip today Heat/rest Hip exercises as tolerated       Relevant Orders   Ambulatory referral to Orthopedic Surgery     Other   Morbid obesity ([place])    Work on diet and exercise       Anemia    Order cbc pending results        Relevant Orders   CBC with Differential/Platelet (Completed)   B12 and Folate Panel (Completed)   Attention deficit hyperactivity disorder (ADHD), predominantly hyperactive type    Pt declines medications        Left hip pain    Likely r/t legg calve perthes  Xray today Referring to ortho fmla paperwork to be filled out once dropped off       Relevant Orders   Ambulatory referral to Orthopedic Surgery   Chronic fatigue    fatiuge workup with labs ordered pending results       Relevant Orders   Thyroid Peroxidase Antibodies (TPO) (REFL) (Completed)   T3, free (Completed)   T4, free (Completed)   TSH (Completed)   Comprehensive metabolic panel (Completed)   CBC with Differential/Platelet (Completed)   B12 and Folate Panel (Completed)   Hyperglycemia    Ordered hga1c pending results Work on diab diet exercise as tolerated       Relevant Orders   Hemoglobin A1c (Completed)   Thyroid function test abnormal    Thyroid panel ordered pending results Consider US thyroid        Encounter for vaccination  tdap vaccine administered in office Pt tolerated procedure well  Verbal consent obtained prior to administration  Handout given in regards to vaccination.         Relevant Orders   Tdap vaccine greater than or equal to 7yo IM (Completed)   Mixed hyperlipidemia    Ordered lipid panel, pending results. Work on low cholesterol diet and exercise as tolerated        Relevant Orders   Lipid panel (Completed)    Meds ordered this encounter  Medications   traMADol (ULTRAM) 50 MG tablet    Sig: Take 1 tablet (50 mg total) by mouth every 6  (six) hours as needed for up to 5 days.    Dispense:  20 tablet    Refill:  0    Order Specific Question:   Supervising Provider    Answer:   Diona Browner, AMY E [id number]    Follow-up: Return in about 6 months (around 08/22/2022) for every six month follow up appt.    Eugenia Pancoast, FNP

## 2022-02-19 NOTE — Patient Instructions (Addendum)
Welcome to our clinic, I am happy to have you as my new patient. I am excited to continue on this healthcare journey with you. ? ?A referral was placed today for orthopedist.  ?Please let us know if you have not heard back within 2 weeks about the referral. ? ?Complete xray(s) prior to leaving today. I will notify you of your results once received. ? ?Stop by the lab prior to leaving today. I will notify you of your results once received.  ? ?Please keep in mind ?Any my chart messages you send have p to a three business day turnaround for a response.  ?Phone calls may have up to a one day business turnaround for a  response.  ? ?If you need a medication refill I recommend you request it through the pharmacy as this is easiest for [place] rather than sending a message and or phone call.  ? ?Due to recent changes in healthcare laws, you may see results of your imaging and/or laboratory studies on MyChart before I have had a chance to review them.  I understand that in some cases there may be results that are confusing or concerning to you. Please understand that not all results are received at the same time and often I may need to interpret multiple results in order to provide you with the best plan of care or course of treatment. Therefore, I ask that you please give me 2 business days to thoroughly review all your results before contacting my office for clarification. Should we see a critical lab result, you will be contacted sooner.  ? ?It was a pleasure seeing you today! Please do not hesitate to reach out with any questions and or concerns. ? ?Regards,  ? ?Iliyana Convey ?FNP-C ? ?

## 2022-02-20 ENCOUNTER — Other Ambulatory Visit: Payer: BC Managed Care – PPO

## 2022-02-20 ENCOUNTER — Telehealth: Payer: Self-pay

## 2022-02-20 ENCOUNTER — Other Ambulatory Visit: Payer: Self-pay | Admitting: Family

## 2022-02-20 DIAGNOSIS — R7989 Other specified abnormal findings of blood chemistry: Secondary | ICD-10-CM

## 2022-02-20 DIAGNOSIS — E538 Deficiency of other specified B group vitamins: Secondary | ICD-10-CM

## 2022-02-20 LAB — TSH: TSH: 1.12 u[IU]/mL (ref 0.35–5.50)

## 2022-02-20 LAB — CBC WITH DIFFERENTIAL/PLATELET
Basophils Absolute: 0 10*3/uL (ref 0.0–0.1)
Basophils Relative: 0.5 % (ref 0.0–3.0)
Eosinophils Absolute: 0.1 10*3/uL (ref 0.0–0.7)
Eosinophils Relative: 0.9 % (ref 0.0–5.0)
HCT: 40 % (ref 36.0–46.0)
Hemoglobin: 13.3 g/dL (ref 12.0–15.0)
Lymphocytes Relative: 35 % (ref 12.0–46.0)
Lymphs Abs: 2.5 10*3/uL (ref 0.7–4.0)
MCHC: 33.2 g/dL (ref 30.0–36.0)
MCV: 86.5 fl (ref 78.0–100.0)
Monocytes Absolute: 0.3 10*3/uL (ref 0.1–1.0)
Monocytes Relative: 4.4 % (ref 3.0–12.0)
Neutro Abs: 4.3 10*3/uL (ref 1.4–7.7)
Neutrophils Relative %: 59.2 % (ref 43.0–77.0)
Platelets: 339 10*3/uL (ref 150.0–400.0)
RBC: 4.62 Mil/uL (ref 3.87–5.11)
RDW: 14.6 % (ref 11.5–15.5)
WBC: 7.2 10*3/uL (ref 4.0–10.5)

## 2022-02-20 LAB — COMPREHENSIVE METABOLIC PANEL
ALT: 46 U/L — ABNORMAL HIGH (ref 0–35)
AST: 27 U/L (ref 0–37)
Albumin: 4 g/dL (ref 3.5–5.2)
Alkaline Phosphatase: 128 U/L — ABNORMAL HIGH (ref 39–117)
BUN: 8 mg/dL (ref 6–23)
CO2: 25 mEq/L (ref 19–32)
Calcium: 9 mg/dL (ref 8.4–10.5)
Chloride: 99 mEq/L (ref 96–112)
Creatinine, Ser: 0.59 mg/dL (ref 0.40–1.20)
GFR: 121.58 mL/min (ref >60.00)
Glucose, Bld: 305 mg/dL — ABNORMAL HIGH (ref 70–99)
Potassium: 4.4 mEq/L (ref 3.5–5.1)
Sodium: 133 mEq/L — ABNORMAL LOW (ref 135–145)
Total Bilirubin: 0.4 mg/dL (ref 0.2–1.2)
Total Protein: 7 g/dL (ref 6.0–8.3)

## 2022-02-20 LAB — T3, FREE: T3, Free: 3.1 pg/mL (ref 2.3–4.2)

## 2022-02-20 LAB — B12 AND FOLATE PANEL
Folate: 5.3 ng/mL — ABNORMAL LOW (ref >5.9)
Vitamin B-12: 134 pg/mL — ABNORMAL LOW (ref 211–911)

## 2022-02-20 LAB — LIPID PANEL
Cholesterol: 220 mg/dL — ABNORMAL HIGH (ref 0–200)
HDL: 28.4 mg/dL — ABNORMAL LOW (ref >39.00)
Total CHOL/HDL Ratio: 8
Triglycerides: 469 mg/dL — ABNORMAL HIGH (ref 0.0–149.0)

## 2022-02-20 LAB — LDL CHOLESTEROL, DIRECT: Direct LDL: 132 mg/dL

## 2022-02-20 LAB — T4, FREE: Free T4: 1.01 ng/dL (ref 0.60–1.60)

## 2022-02-20 LAB — HEMOGLOBIN A1C: Hgb A1c MFr Bld: 11.5 % — ABNORMAL HIGH (ref 4.6–6.5)

## 2022-02-20 NOTE — Telephone Encounter (Signed)
FMLA forms received from patient's mother. ?

## 2022-02-20 NOTE — Progress Notes (Signed)
Can we add MMA and also hepatitis panel? ?Adding orders as future if able.

## 2022-02-20 NOTE — Telephone Encounter (Signed)
Placed FMLA forms in your in box for completion.  Patient requests FMLA forms completed for time out of work for leg/hip pain for May 4 - 7 and May 12. ?

## 2022-02-21 ENCOUNTER — Other Ambulatory Visit: Payer: Self-pay

## 2022-02-21 ENCOUNTER — Other Ambulatory Visit: Payer: Self-pay | Admitting: Family

## 2022-02-21 DIAGNOSIS — E538 Deficiency of other specified B group vitamins: Secondary | ICD-10-CM

## 2022-02-21 LAB — THYROID PEROXIDASE ANTIBODIES (TPO) (REFL): Thyroperoxidase Ab SerPl-aCnc: 85 IU/mL — ABNORMAL HIGH (ref <9)

## 2022-02-21 MED ORDER — FOLIC ACID 1 MG PO TABS
1.0000 mg | ORAL_TABLET | Freq: Every day | ORAL | 0 refills | Status: DC
  Filled 2022-02-21: qty 30, 30d supply, fill #0
  Filled 2022-03-23: qty 30, 30d supply, fill #1
  Filled 2022-04-22 – 2022-05-08 (×2): qty 30, 30d supply, fill #2

## 2022-02-21 NOTE — Progress Notes (Signed)
You were found to be diabetic, and your HGA1C is pretty elevated at 11.5.  Have you ever been on medications for diabetes in the past?  B12 is VERY low and can contribute to fatigue, folic acid is also very low I will send in RX for this to get started once daily.   Vitamin B12 very low, please set up for B12 injections, 1000 mcg IM once monthly for three months, also schedule 3 month f/u appt to repeat labs and f/u in office. Recommend also oral otc 1000 mcg once daily vitamin B12  Sodium on lower end.  Liver function also slightly elevated, any right sided upper abdominal pain? Taking NSAIDS? Etoh? Tylenol? Herbal supplements?  Cholesterol is VERY elevated, triglycerides especially.  Have you ever taken statin or anything for cholesterol in the past? Thyroid ok.

## 2022-02-22 ENCOUNTER — Encounter: Payer: Self-pay | Admitting: Family

## 2022-02-22 ENCOUNTER — Encounter: Payer: Self-pay | Admitting: *Deleted

## 2022-02-22 NOTE — Telephone Encounter (Signed)
FMLA forms completed and faxed to fax # [phone].  Spoke to patient's mother, Charlotte Fidalgo, who requested she be able to pick up patient's FMLA forms.  Forms ready at front desk.  Copy sent for scanning and a copy kept for my records.

## 2022-02-24 LAB — HEPATITIS PANEL, ACUTE
Hep A IgM: NONREACTIVE
Hep B C IgM: NONREACTIVE
Hepatitis B Surface Ag: NONREACTIVE
Hepatitis C Ab: NONREACTIVE
SIGNAL TO CUT-OFF: 0.04 (ref <1.00)

## 2022-02-24 LAB — METHYLMALONIC ACID, SERUM: Methylmalonic Acid, Quant: 117 nmol/L (ref 87–318)

## 2022-02-26 ENCOUNTER — Other Ambulatory Visit: Payer: Self-pay | Admitting: Family

## 2022-02-26 ENCOUNTER — Other Ambulatory Visit: Payer: Self-pay

## 2022-02-26 DIAGNOSIS — E538 Deficiency of other specified B group vitamins: Secondary | ICD-10-CM

## 2022-02-26 DIAGNOSIS — E1165 Type 2 diabetes mellitus with hyperglycemia: Secondary | ICD-10-CM

## 2022-02-26 MED ORDER — GLIPIZIDE 5 MG PO TABS
5.0000 mg | ORAL_TABLET | Freq: Two times a day (BID) | ORAL | 3 refills | Status: DC
Start: 2022-02-26 — End: 2022-09-01
  Filled 2022-02-26: qty 60, 30d supply, fill #0
  Filled 2022-03-23: qty 60, 30d supply, fill #1
  Filled 2022-04-22 – 2022-05-08 (×2): qty 60, 30d supply, fill #2
  Filled 2022-06-28: qty 60, 30d supply, fill #3

## 2022-02-26 MED ORDER — CYANOCOBALAMIN 1000 MCG/ML IJ SOLN
1000.0000 ug | INTRAMUSCULAR | 2 refills | Status: DC
  Filled 2022-02-26: qty 1, 30d supply, fill #0
  Filled 2022-03-23: qty 1, 30d supply, fill #1
  Filled 2022-04-22 – 2022-05-08 (×2): qty 1, 30d supply, fill #2

## 2022-02-26 MED ORDER — METFORMIN HCL 500 MG PO TABS
500.0000 mg | ORAL_TABLET | Freq: Every day | ORAL | 0 refills | Status: DC
Start: 2022-02-26 — End: 2022-05-21
  Filled 2022-02-26: qty 30, 30d supply, fill #0
  Filled 2022-03-23: qty 30, 30d supply, fill #1
  Filled 2022-04-22 – 2022-05-08 (×2): qty 30, 30d supply, fill #2

## 2022-02-27 NOTE — Assessment & Plan Note (Signed)
Order cbc pending results. 

## 2022-02-27 NOTE — Assessment & Plan Note (Signed)
Ordered hga1c pending results Work on [employer] exercise as tolerated

## 2022-02-27 NOTE — Assessment & Plan Note (Signed)
Work on diet and exercise 

## 2022-02-27 NOTE — Assessment & Plan Note (Signed)
Likely r/t legg calve perthes  Xray today Referring to ortho fmla paperwork to be filled out once dropped off

## 2022-02-27 NOTE — Assessment & Plan Note (Signed)
Pt declines medications

## 2022-02-27 NOTE — Assessment & Plan Note (Signed)
Thyroid panel ordered pending results Consider US thyroid

## 2022-02-27 NOTE — Assessment & Plan Note (Signed)
Ordered lipid panel, pending results. Work on low cholesterol diet and exercise as tolerated ? ?

## 2022-02-27 NOTE — Assessment & Plan Note (Signed)
Referral to orthopedist Xray left hip today Heat/rest Hip exercises as tolerated

## 2022-02-27 NOTE — Assessment & Plan Note (Signed)
tdap vaccine administered in office Pt tolerated procedure well  Verbal consent obtained prior to administration  Handout given in regards to vaccination.   

## 2022-02-27 NOTE — Assessment & Plan Note (Signed)
fatiuge workup with labs ordered pending results

## 2022-03-02 DIAGNOSIS — M1612 Unilateral primary osteoarthritis, left hip: Secondary | ICD-10-CM | POA: Diagnosis not present

## 2022-03-05 ENCOUNTER — Other Ambulatory Visit: Payer: Self-pay

## 2022-03-05 MED ORDER — TRAMADOL HCL 50 MG PO TABS
ORAL_TABLET | ORAL | 0 refills | Status: DC
  Filled 2022-03-05: qty 40, 6d supply, fill #0

## 2022-03-06 ENCOUNTER — Other Ambulatory Visit: Payer: Self-pay

## 2022-03-06 ENCOUNTER — Other Ambulatory Visit: Payer: Self-pay | Admitting: Family

## 2022-03-06 DIAGNOSIS — E781 Pure hyperglyceridemia: Secondary | ICD-10-CM

## 2022-03-06 MED ORDER — OMEGA-3-ACID ETHYL ESTERS 1 G PO CAPS
2.0000 g | ORAL_CAPSULE | Freq: Two times a day (BID) | ORAL | 2 refills | Status: DC
  Filled 2022-03-06: qty 120, 30d supply, fill #0
  Filled 2022-04-22 – 2022-05-08 (×2): qty 120, 30d supply, fill #1
  Filled 2022-06-28: qty 120, 30d supply, fill #2

## 2022-03-08 ENCOUNTER — Other Ambulatory Visit: Payer: Self-pay

## 2022-03-19 DIAGNOSIS — M7062 Trochanteric bursitis, left hip: Secondary | ICD-10-CM | POA: Diagnosis not present

## 2022-03-19 DIAGNOSIS — M1612 Unilateral primary osteoarthritis, left hip: Secondary | ICD-10-CM | POA: Diagnosis not present

## 2022-03-22 ENCOUNTER — Ambulatory Visit: Payer: BC Managed Care – PPO | Admitting: Family

## 2022-03-24 ENCOUNTER — Other Ambulatory Visit: Payer: Self-pay

## 2022-03-25 ENCOUNTER — Other Ambulatory Visit: Payer: Self-pay

## 2022-04-15 DIAGNOSIS — M7062 Trochanteric bursitis, left hip: Secondary | ICD-10-CM | POA: Diagnosis not present

## 2022-04-15 DIAGNOSIS — M1612 Unilateral primary osteoarthritis, left hip: Secondary | ICD-10-CM | POA: Diagnosis not present

## 2022-04-22 ENCOUNTER — Other Ambulatory Visit: Payer: Self-pay

## 2022-04-23 ENCOUNTER — Other Ambulatory Visit: Payer: Self-pay

## 2022-04-24 ENCOUNTER — Other Ambulatory Visit: Payer: Self-pay

## 2022-04-24 MED ORDER — TRAMADOL HCL 50 MG PO TABS
ORAL_TABLET | ORAL | 0 refills | Status: DC
  Filled 2022-04-24 – 2022-05-08 (×2): qty 40, 13d supply, fill #0

## 2022-04-25 ENCOUNTER — Other Ambulatory Visit: Payer: Self-pay

## 2022-04-27 ENCOUNTER — Other Ambulatory Visit: Payer: Self-pay

## 2022-05-07 ENCOUNTER — Other Ambulatory Visit: Payer: Self-pay

## 2022-05-08 ENCOUNTER — Other Ambulatory Visit: Payer: Self-pay

## 2022-05-21 ENCOUNTER — Encounter: Payer: Self-pay | Admitting: Family

## 2022-05-21 ENCOUNTER — Ambulatory Visit: Payer: BC Managed Care – PPO | Admitting: Family

## 2022-05-21 ENCOUNTER — Other Ambulatory Visit: Payer: Self-pay

## 2022-05-21 VITALS — BP 112/70 | HR 58 | Temp 98.2°F | Resp 16 | Ht 64.0 in | Wt 304.1 lb

## 2022-05-21 DIAGNOSIS — E871 Hypo-osmolality and hyponatremia: Secondary | ICD-10-CM | POA: Insufficient documentation

## 2022-05-21 DIAGNOSIS — R7989 Other specified abnormal findings of blood chemistry: Secondary | ICD-10-CM | POA: Insufficient documentation

## 2022-05-21 DIAGNOSIS — E1165 Type 2 diabetes mellitus with hyperglycemia: Secondary | ICD-10-CM

## 2022-05-21 DIAGNOSIS — M9112 Juvenile osteochondrosis of head of femur [Legg-Calve-Perthes], left leg: Secondary | ICD-10-CM

## 2022-05-21 DIAGNOSIS — M25552 Pain in left hip: Secondary | ICD-10-CM | POA: Diagnosis not present

## 2022-05-21 DIAGNOSIS — R748 Abnormal levels of other serum enzymes: Secondary | ICD-10-CM

## 2022-05-21 DIAGNOSIS — E538 Deficiency of other specified B group vitamins: Secondary | ICD-10-CM | POA: Diagnosis not present

## 2022-05-21 DIAGNOSIS — E063 Autoimmune thyroiditis: Secondary | ICD-10-CM | POA: Insufficient documentation

## 2022-05-21 DIAGNOSIS — E781 Pure hyperglyceridemia: Secondary | ICD-10-CM

## 2022-05-21 LAB — POCT GLYCOSYLATED HEMOGLOBIN (HGB A1C): Hemoglobin A1C: 9 % — AB (ref 4.0–5.6)

## 2022-05-21 MED ORDER — OZEMPIC (0.25 OR 0.5 MG/DOSE) 2 MG/3ML ~~LOC~~ SOPN
PEN_INJECTOR | SUBCUTANEOUS | 0 refills | Status: AC
  Filled 2022-05-21: qty 3, 28d supply, fill #0
  Filled 2022-05-21 (×2): qty 3, 30d supply, fill #0

## 2022-05-21 MED ORDER — METFORMIN HCL 500 MG PO TABS
500.0000 mg | ORAL_TABLET | Freq: Two times a day (BID) | ORAL | 0 refills | Status: DC
  Filled 2022-05-21: qty 60, 30d supply, fill #0
  Filled 2022-06-28: qty 60, 30d supply, fill #1

## 2022-05-21 NOTE — Progress Notes (Signed)
Established Patient Office Visit  Subjective:  Patient ID: Christina Cobb, female    DOB: [birth year]  Age: 31 y.o. MRN: [id number]  CC:  Chief Complaint  Patient presents with   Diabetes    HPI Christina Cobb is here today for follow up.   Leggs calves perthes disease: saw orthopedist, was given steroid injection. Can not have repeat until October. Still in pain. Not currently in pain however when moving around increasing pain sometimes 10/10. The left hip is the most painful.   DM2: fasting not less than 170. Average typically less than 200. Has cut out carbs and breads.  Wt Readings from Last 3 Encounters:  05/21/22 (!) 304 lb 2 oz (138 kg)  02/19/22 (!) 309 lb (140.2 kg)  08/13/21 (!) 301 lb (136.5 kg)   Lab Results  Component Value Date   HGBA1C 9.0 (A) 05/21/2022  Used to be 11.2 last visit.   Vitamin b12 def: b12 1000 mcg once a month, has one left to do. Taking over the counter b12 1000 mcg once daily.    Past Medical History:  Diagnosis Date   GERD (gastroesophageal reflux disease)    Legg-Perthes disease     Past Surgical History:  Procedure Laterality Date   HIP SURGERY Left 1998   due to Legg Prethes Disease   TONSILLECTOMY AND ADENOIDECTOMY  1997    Family History  Adopted: Yes    Social History   Socioeconomic History   Marital status: Single    Spouse name: Not on file   Number of children: 0   Years of education: Not on file   Highest education level: Not on file  Occupational History   Occupation: [employer]  Tobacco Use   Smoking status: Never   Smokeless tobacco: Never  Vaping Use   Vaping Use: Never used  Substance and Sexual Activity   Alcohol use: No   Drug use: No   Sexual activity: Never  Other Topics Concern   Not on file  Social History Narrative   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    Social Determinants of Health   Financial Resource Strain: Not on file  Food Insecurity: Not on file   Transportation Needs: Not on file  Physical Activity: Not on file  Stress: Not on file  Social Connections: Not on file  Intimate Partner Violence: Not on file    Outpatient Medications Prior to Visit  Medication Sig Dispense Refill   cyanocobalamin (VITAMIN B12) 1000 MCG/ML injection Inject 1 mL (1,000 mcg total) into the muscle every 30 (thirty) days. Please include 10 ml syringe as well as 25 g needle with b12 vial 1 mL 2   folic acid (FOLVITE) 1 MG tablet Take 1 tablet (1 mg total) by mouth daily. 90 tablet 0   glipiZIDE (GLUCOTROL) 5 MG tablet Take 1 tablet (5 mg total) by mouth 2 (two) times daily before a meal. 60 tablet 3   omega-3 acid ethyl esters (LOVAZA) 1 g capsule Take 2 capsules (2 g total) by mouth 2 (two) times daily. 120 capsule 2   traMADol (ULTRAM) 50 MG tablet Take 2 tablet by mouth every eight hours as needed for pain 40 tablet 0   metFORMIN (GLUCOPHAGE) 500 MG tablet Take 1 tablet (500 mg total) by mouth daily. 90 tablet 0   traMADol (ULTRAM) 50 MG tablet Take 1 tablet by mouth every 8 hours as needed for pain 40 tablet 0   No facility-administered medications  prior to visit.    No Known Allergies        Objective:    Physical Exam Constitutional:      General: She is not in acute distress.    Appearance: Normal appearance. She is obese. She is not ill-appearing, toxic-appearing or diaphoretic.  Cardiovascular:     Rate and Rhythm: Normal rate and regular rhythm.  Pulmonary:     Effort: Pulmonary effort is normal.  Neurological:     General: No focal deficit present.     Mental Status: She is alert and oriented to person, place, and time. Mental status is at baseline.  Psychiatric:        Mood and Affect: Mood normal.        Behavior: Behavior normal.        Thought Content: Thought content normal.        Judgment: Judgment normal.       BP 112/70   Pulse (!) 58   Temp 98.2 F (36.8 C)   Resp 16   Ht 5\' 4"  (1.626 m)   Wt (!) 304 lb 2 oz  (138 kg)   LMP 05/07/2022   SpO2 97%   BMI 52.20 kg/m  Wt Readings from Last 3 Encounters:  05/21/22 (!) 304 lb 2 oz (138 kg)  02/19/22 (!) 309 lb (140.2 kg)  08/13/21 (!) 301 lb (136.5 kg)     Health Maintenance Due  Topic Date Due   FOOT EXAM  Never done   OPHTHALMOLOGY EXAM  Never done   URINE MICROALBUMIN  Never done   HIV Screening  Never done   PAP SMEAR-Modifier  Never done   COVID-19 Vaccine (3 - Pfizer series) 05/06/2020   INFLUENZA VACCINE  05/07/2022    There are no preventive care reminders to display for this patient.  Lab Results  Component Value Date   TSH 1.12 02/19/2022   Lab Results  Component Value Date   WBC 7.2 02/19/2022   HGB 13.3 02/19/2022   HCT 40.0 02/19/2022   MCV 86.5 02/19/2022   PLT 339.0 02/19/2022   Lab Results  Component Value Date   NA 133 (L) 02/19/2022   K 4.4 02/19/2022   CO2 25 02/19/2022   GLUCOSE 305 (H) 02/19/2022   BUN 8 02/19/2022   CREATININE 0.59 02/19/2022   BILITOT 0.4 02/19/2022   ALKPHOS 128 (H) 02/19/2022   AST 27 02/19/2022   ALT 46 (H) 02/19/2022   PROT 7.0 02/19/2022   ALBUMIN 4.0 02/19/2022   CALCIUM 9.0 02/19/2022   GFR 121.58 02/19/2022   Lab Results  Component Value Date   CHOL 206 (H) 05/21/2022   Lab Results  Component Value Date   HDL 26.30 (L) 05/21/2022   Lab Results  Component Value Date   LDLCALC 120 (H) 05/12/2013   Lab Results  Component Value Date   TRIG 267.0 (H) 05/21/2022   Lab Results  Component Value Date   CHOLHDL 8 05/21/2022   Lab Results  Component Value Date   HGBA1C 9.0 (A) 05/21/2022      Assessment & Plan:   Problem List Items Addressed This Visit       Endocrine   Type 2 diabetes mellitus with hyperglycemia, without long-term current use of insulin (HCC)    Will try to get ozempic approved Will help with diabetes as well as weight loss.  rx ozempic 0.25 qweek x 4 weeks then increase to 0.5 mg qweek Ordered hga1c today pending results. Work  on  diabetic diet and exercise as tolerated. Yearly foot exam, and annual eye exam.        Relevant Medications   Semaglutide,0.25 or 0.5MG /DOS, (OZEMPIC, 0.25 OR 0.5 MG/DOSE,) 2 MG/3ML SOPN   metFORMIN (GLUCOPHAGE) 500 MG tablet   Other Relevant Orders   Ambulatory referral to Physical Therapy   POCT glycosylated hemoglobin (Hb A1C) (Completed)     Musculoskeletal and Integument   Legg-Calve-Perthes disease, left    continue f/u with specialist         Other   Left hip pain    R/t legg calve perthes Referral placed for physical therapy as ongoing pain and limitation of movement affecting pt IADLs      Relevant Orders   Ambulatory referral to Physical Therapy   Elevated alkaline phosphatase level    Repeat lft today pending results      Elevated LFTs - Primary   Hypertriglyceridemia    Lipid panel ordered pending results.  Continue medication as prescribed       Relevant Orders   Lipid panel (Completed)   B12 deficiency    Ordering b12 folate pending results      Relevant Orders   B12 and Folate Panel (Completed)    Meds ordered this encounter  Medications   Semaglutide,0.25 or 0.5MG /DOS, (OZEMPIC, 0.25 OR 0.5 MG/DOSE,) 2 MG/3ML SOPN    Sig: Inject 0.25 mg into the skin once a week for 30 days, THEN 0.5 mg once a week.    Dispense:  3 mL    Refill:  0    stock size 3 mL    Order Specific Question:   Supervising Provider    Answer:   BEDSOLE, AMY E [[id number]]   metFORMIN (GLUCOPHAGE) 500 MG tablet    Sig: Take 1 tablet (500 mg total) by mouth 2 (two) times daily with a meal.    Dispense:  180 tablet    Refill:  0    Order Specific Question:   Supervising Provider    Answer:   Ermalene Searing, AMY E [[id number]    Follow-up: Return in about 3 months (around 08/21/2022) for regular follow up appt.    Mort Sawyers, FNP

## 2022-05-21 NOTE — Patient Instructions (Addendum)
A referral was placed today for physical therapy.  Please let us know if you have not heard back within 2 weeks about the referral.  Increase metformin to 500 mg twice daily.  Then after one week if doing ok with stomach , and no symptoms increase to two tablets in the am and one at night.  If still doing well,  After one week increase to two tablets twice daily for goal of 1000 mg twice daily. Let me know if you get to this point as next refill I will send in 1000 mg instead.   Due to recent changes in healthcare laws, you may see results of your imaging and/or laboratory studies on MyChart before I have had a chance to review them.  I understand that in some cases there may be results that are confusing or concerning to you. Please understand that not all results are received at the same time and often I may need to interpret multiple results in order to provide you with the best plan of care or course of treatment. Therefore, I ask that you please give me 2 business days to thoroughly review all your results before contacting my office for clarification. Should we see a critical lab result, you will be contacted sooner.   It was a pleasure seeing you today! Please do not hesitate to reach out with any questions and or concerns.  Regards,   Mort Sawyers FNP-C

## 2022-05-22 ENCOUNTER — Other Ambulatory Visit: Payer: Self-pay | Admitting: Family

## 2022-05-22 DIAGNOSIS — E782 Mixed hyperlipidemia: Secondary | ICD-10-CM

## 2022-05-22 LAB — LIPID PANEL
Cholesterol: 206 mg/dL — ABNORMAL HIGH (ref 0–200)
HDL: 26.3 mg/dL — ABNORMAL LOW (ref >39.00)
NonHDL: 179.53
Total CHOL/HDL Ratio: 8
Triglycerides: 267 mg/dL — ABNORMAL HIGH (ref 0.0–149.0)
VLDL: 53.4 mg/dL — ABNORMAL HIGH (ref 0.0–40.0)

## 2022-05-22 LAB — LDL CHOLESTEROL, DIRECT: Direct LDL: 145 mg/dL

## 2022-05-22 LAB — B12 AND FOLATE PANEL
Folate: 15.5 ng/mL (ref >5.9)
Vitamin B-12: 278 pg/mL (ref 211–911)

## 2022-05-22 MED ORDER — ATORVASTATIN CALCIUM 10 MG PO TABS
10.0000 mg | ORAL_TABLET | Freq: Every day | ORAL | 3 refills | Status: DC
  Filled 2022-05-22: qty 90, 90d supply, fill #0
  Filled 2022-05-27: qty 30, 30d supply, fill #0
  Filled 2022-06-28 (×2): qty 30, 30d supply, fill #1

## 2022-05-22 NOTE — Telephone Encounter (Signed)
Sent to [facility] on [address]

## 2022-05-23 ENCOUNTER — Other Ambulatory Visit: Payer: Self-pay

## 2022-05-24 ENCOUNTER — Telehealth: Payer: Self-pay

## 2022-05-24 NOTE — Telephone Encounter (Signed)
Noted thank you

## 2022-05-24 NOTE — Telephone Encounter (Signed)
Prior auth for Ozempic (0.25 or 0.5 MG/DOSE) 2MG /3ML pen-injectors has been cancelled. (Key: Wendie Simmer) This request for a prior authorization has been cancelled. Why was my request cancelled? This medication currently does require a prior authorization, therefore you may fill this medication at a [facility] or [id number] located near you. Please review the Benefit Plan Availability Tool on [email or web address] to see what pharmacies are available in your area.  Letter sent to scanning.  Patient notified via mychart.

## 2022-05-24 NOTE — Telephone Encounter (Signed)
Prior auth started for Ozempic (0.25 or 0.5 MG/DOSE) 2MG /3ML pen-injectors. (Key: Wendie Simmer) Waiting for determination.

## 2022-05-27 ENCOUNTER — Other Ambulatory Visit: Payer: Self-pay

## 2022-05-27 NOTE — Assessment & Plan Note (Signed)
Ordering b12 folate pending results

## 2022-05-27 NOTE — Assessment & Plan Note (Signed)
continue f/u with specialist

## 2022-05-27 NOTE — Assessment & Plan Note (Signed)
R/t legg calve perthes Referral placed for physical therapy as ongoing pain and limitation of movement affecting pt IADLs

## 2022-05-27 NOTE — Assessment & Plan Note (Signed)
Lipid panel ordered pending results.  Continue medication as prescribed

## 2022-05-27 NOTE — Assessment & Plan Note (Signed)
Will try to get ozempic approved Will help with diabetes as well as weight loss.  rx ozempic 0.25 qweek x 4 weeks then increase to 0.5 mg qweek Ordered hga1c today pending results. Work on diabetic diet and exercise as tolerated. Yearly foot exam, and annual eye exam.

## 2022-05-27 NOTE — Assessment & Plan Note (Signed)
Repeat lft today pending results 

## 2022-06-03 ENCOUNTER — Other Ambulatory Visit: Payer: Self-pay

## 2022-06-03 DIAGNOSIS — E1165 Type 2 diabetes mellitus with hyperglycemia: Secondary | ICD-10-CM

## 2022-06-03 NOTE — Telephone Encounter (Signed)
Christina Cobb does not like sticking her finger and though her insurance they will approve her for the dexcom. Christina Cobb  the pharmacy said that if you send her a message that we have sample and she would be willing to give to [place] [facility].

## 2022-06-03 NOTE — Telephone Encounter (Signed)
Hi Lindsey, can we set up patient for dexcom? Do you happen to know how I order this?  Do I put in order for both sensor and receiver first time order?

## 2022-06-04 NOTE — Telephone Encounter (Signed)
Dexcom can be ordered to pharmacy for commercial patients. She will not need the receiver since she will use her smartphone to download the Dexcom G7 app.

## 2022-06-04 NOTE — Addendum Note (Signed)
Addended by: Kathyrn Sheriff on: 06/04/2022 08:33 AM   Modules accepted: Orders

## 2022-06-06 ENCOUNTER — Other Ambulatory Visit: Payer: Self-pay

## 2022-06-06 MED ORDER — DEXCOM G7 SENSOR MISC
3 refills | Status: DC
  Filled 2022-06-06: qty 3, 30d supply, fill #0

## 2022-06-06 NOTE — Addendum Note (Signed)
Addended by: Kathyrn Sheriff on: 06/06/2022 09:08 AM   Modules accepted: Orders

## 2022-06-11 ENCOUNTER — Other Ambulatory Visit: Payer: Self-pay

## 2022-06-28 ENCOUNTER — Other Ambulatory Visit: Payer: Self-pay

## 2022-07-01 ENCOUNTER — Other Ambulatory Visit: Payer: Self-pay

## 2022-07-04 ENCOUNTER — Other Ambulatory Visit: Payer: Self-pay

## 2022-07-08 ENCOUNTER — Ambulatory Visit: Payer: BC Managed Care – PPO | Attending: Family

## 2022-07-15 ENCOUNTER — Ambulatory Visit: Payer: BC Managed Care – PPO

## 2022-08-06 ENCOUNTER — Ambulatory Visit: Payer: BC Managed Care – PPO | Admitting: Family

## 2022-08-06 ENCOUNTER — Encounter: Payer: Self-pay | Admitting: Family

## 2022-08-06 VITALS — BP 120/68 | HR 63 | Temp 98.6°F | Resp 16 | Ht 64.0 in | Wt 303.2 lb

## 2022-08-06 DIAGNOSIS — E781 Pure hyperglyceridemia: Secondary | ICD-10-CM | POA: Diagnosis not present

## 2022-08-06 DIAGNOSIS — Z3009 Encounter for other general counseling and advice on contraception: Secondary | ICD-10-CM | POA: Insufficient documentation

## 2022-08-06 DIAGNOSIS — E1165 Type 2 diabetes mellitus with hyperglycemia: Secondary | ICD-10-CM

## 2022-08-06 DIAGNOSIS — E538 Deficiency of other specified B group vitamins: Secondary | ICD-10-CM | POA: Diagnosis not present

## 2022-08-06 MED ORDER — METFORMIN HCL 500 MG PO TABS: 1000.0000 mg | ORAL_TABLET | Freq: Two times a day (BID) | ORAL | 1 refills | Status: DC

## 2022-08-06 NOTE — Assessment & Plan Note (Signed)
Ordered lipid panel, pending results. Work on low cholesterol diet and exercise as tolerated ? ?

## 2022-08-06 NOTE — Assessment & Plan Note (Signed)
Referral placed to OB/GYN Also advised patient that if she is to pursue fertility that she needs to consider control of her diabetes as well as weight control.  Patient is aware.

## 2022-08-06 NOTE — Progress Notes (Signed)
Established Patient Office Visit  Subjective:  Patient ID: Christina Cobb, female    DOB: [birth year]  Age: 31 y.o. MRN: [id number]  CC:  Chief Complaint  Patient presents with   Diabetes    HPI Christina Cobb is here today for follow up.    Vitamin b12 def: completed three injections of b12, now taking otc b12 1000 mcg once daily.   Family planning, [employer] will cover artificial insemination so she is interested is pursuing this. She is aware she would be higher risk due to weight and diabetes, however wishing to pursue and also would like to be referred to a obgyn.   Lab Results  Component Value Date   HGBA1C 9.0 (A) 05/21/2022   DM2: on metformin 1000 mg twice daily, still taking glipizide 5 mg twice daily.  Was taking ozempic 0.5 mg for eight weeks however noticed on her dex com that her sugars were dipping ot 40 so she stopped the ozempic. Once she stopped the ozempic she is now higher, and typicaly average is high 215 after eating, and to around 120/130 fasting. At night time will go down to 70. Last ozempic injection was back in September.   Past Medical History:  Diagnosis Date   GERD (gastroesophageal reflux disease)    Legg-Perthes disease     Past Surgical History:  Procedure Laterality Date   HIP SURGERY Left 1998   due to [place] Disease   TONSILLECTOMY AND ADENOIDECTOMY  1997    Family History  Adopted: Yes    Social History   Socioeconomic History   Marital status: Single    Spouse name: Not on file   Number of children: 0   Years of education: Not on file   Highest education level: Not on file  Occupational History   Occupation: [employer]  Tobacco Use   Smoking status: Never   Smokeless tobacco: Never  Vaping Use   Vaping Use: Never used  Substance and Sexual Activity   Alcohol use: No   Drug use: No   Sexual activity: Never  Other Topics Concern   Not on file  Social History Narrative   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    Social Determinants of Health   Financial Resource Strain: Not on file  Food Insecurity: Not on file  Transportation Needs: Not on file  Physical Activity: Not on file  Stress: Not on file  Social Connections: Not on file  Intimate Partner Violence: Not on file    Outpatient Medications Prior to Visit  Medication Sig Dispense Refill   atorvastatin (LIPITOR) 10 MG tablet Take 1 tablet (10 mg total) by mouth daily. 90 tablet 3   Continuous Blood Gluc Sensor (DEXCOM G7 SENSOR) MISC Apply sensor every 10 days to monitor glucose continuously 3 each 3   folic acid (FOLVITE) 1 MG tablet Take 1 tablet (1 mg total) by mouth daily. 90 tablet 0   glipiZIDE (GLUCOTROL) 5 MG tablet Take 1 tablet (5 mg total) by mouth 2 (two) times daily before a meal. 60 tablet 3   traMADol (ULTRAM) 50 MG tablet Take 2 tablet by mouth every eight hours as needed for pain 40 tablet 0   metFORMIN (GLUCOPHAGE) 500 MG tablet Take 1 tablet (500 mg total) by mouth 2 (two) times daily with a meal. 180 tablet 0   omega-3 acid ethyl esters (LOVAZA) 1 g capsule Take 2 capsules (2 g total) by mouth 2 (two) times daily. [name]  capsule 2   cyanocobalamin (VITAMIN B12) 1000 MCG/ML injection Inject 1 mL (1,000 mcg total) into the muscle every 30 (thirty) days. Please include 10 ml syringe as well as 25 g needle with b12 vial 1 mL 2   No facility-administered medications prior to visit.    No Known Allergies       Objective:    Physical Exam Constitutional:      General: She is not in acute distress.    Appearance: Normal appearance. She is obese. She is not ill-appearing, toxic-appearing or diaphoretic.  Cardiovascular:     Rate and Rhythm: Normal rate. Rhythm irregular.  Pulmonary:     Effort: Pulmonary effort is normal.  Neurological:     General: No focal deficit present.     Mental Status: She is alert and oriented to person, place, and time. Mental status is at baseline.  Psychiatric:         Mood and Affect: Mood normal.        Behavior: Behavior normal.        Thought Content: Thought content normal.        Judgment: Judgment normal.       BP 120/68   Pulse 63   Temp 98.6 F (37 C)   Resp 16   Ht 5\' 4"  (1.626 m)   Wt (!) 303 lb 4 oz (137.6 kg)   LMP 07/18/2022   SpO2 99%   BMI 52.05 kg/m  Wt Readings from Last 3 Encounters:  08/06/22 (!) 303 lb 4 oz (137.6 kg)  05/21/22 (!) 304 lb 2 oz (138 kg)  02/19/22 (!) 309 lb (140.2 kg)     Health Maintenance Due  Topic Date Due   FOOT EXAM  Never done   OPHTHALMOLOGY EXAM  Never done   HIV Screening  Never done   Diabetic kidney evaluation - Urine ACR  Never done   PAP SMEAR-Modifier  Never done   COVID-19 Vaccine (3 - Pfizer series) 05/06/2020    There are no preventive care reminders to display for this patient.  Lab Results  Component Value Date   TSH 1.12 02/19/2022   Lab Results  Component Value Date   WBC 7.2 02/19/2022   HGB 13.3 02/19/2022   HCT 40.0 02/19/2022   MCV 86.5 02/19/2022   PLT 339.0 02/19/2022   Lab Results  Component Value Date   NA 133 (L) 02/19/2022   K 4.4 02/19/2022   CO2 25 02/19/2022   GLUCOSE 305 (H) 02/19/2022   BUN 8 02/19/2022   CREATININE 0.59 02/19/2022   BILITOT 0.4 02/19/2022   ALKPHOS 128 (H) 02/19/2022   AST 27 02/19/2022   ALT 46 (H) 02/19/2022   PROT 7.0 02/19/2022   ALBUMIN 4.0 02/19/2022   CALCIUM 9.0 02/19/2022   GFR 121.58 02/19/2022   Lab Results  Component Value Date   CHOL 206 (H) 05/21/2022   Lab Results  Component Value Date   HDL 26.30 (L) 05/21/2022   Lab Results  Component Value Date   LDLCALC 120 (H) 05/12/2013   Lab Results  Component Value Date   TRIG 267.0 (H) 05/21/2022   Lab Results  Component Value Date   CHOLHDL 8 05/21/2022   Lab Results  Component Value Date   HGBA1C 9.0 (A) 05/21/2022      Assessment & Plan:   Problem List Items Addressed This Visit       Endocrine   Type 2 diabetes mellitus with  hyperglycemia, without long-term  current use of insulin (HCC)    Continue metformin as prescribed continue glipizide 5 mg twice daily Did advise patient prior stopping medication in the future please advise me first.  As glipizide was more likely to be causing hypoglycemia.  Continue with Dexcom.  Advised patient to work on diabetic diet and exercise as tolerated. We will obtain A1c in 2 weeks and go from there.      Relevant Medications   metFORMIN (GLUCOPHAGE) 500 MG tablet   Other Relevant Orders   Hemoglobin A1c     Other   Hypertriglyceridemia    Ordered lipid panel, pending results. Work on low cholesterol diet and exercise as tolerated       Relevant Orders   Lipid panel   B12 deficiency - Primary   Relevant Orders   B12 and Folate Panel   Family planning counseling    Referral placed to OB/GYN Also advised patient that if she is to pursue fertility that she needs to consider control of her diabetes as well as weight control.  Patient is aware.       Relevant Orders   Ambulatory referral to Obstetrics / Gynecology    Meds ordered this encounter  Medications   metFORMIN (GLUCOPHAGE) 500 MG tablet    Sig: Take 2 tablets (1,000 mg total) by mouth in the morning and at bedtime.    Dispense:  180 tablet    Refill:  1    Order Specific Question:   Supervising Provider    Answer:   BEDSOLE, AMY E [[id number]]    Follow-up: No follow-ups on file.    Mort Sawyers, FNP

## 2022-08-06 NOTE — Assessment & Plan Note (Addendum)
Continue metformin as prescribed continue glipizide 5 mg twice daily Did advise patient prior stopping medication in the future please advise me first.  As glipizide was more likely to be causing hypoglycemia.  Continue with Dexcom.  Advised patient to work on diabetic diet and exercise as tolerated. We will obtain A1c in 2 weeks and go from there.

## 2022-08-06 NOTE — Patient Instructions (Signed)
   I have created an order for lab work today during our visit.  Please schedule an appointment on your way out to return to the lab at your convenience. Please return fasting at your lab appointment (meaning you can only drink black coffee and or water prior to your appointment). I will reach out to you in regards to the labs when I receive the results.    Regards,   Nakeia Calvi FNP-C  

## 2022-08-26 ENCOUNTER — Other Ambulatory Visit: Payer: Self-pay

## 2022-08-26 DIAGNOSIS — E1165 Type 2 diabetes mellitus with hyperglycemia: Secondary | ICD-10-CM

## 2022-08-26 MED ORDER — METFORMIN HCL 500 MG PO TABS: 1000.0000 mg | ORAL_TABLET | Freq: Two times a day (BID) | ORAL | 1 refills | Status: DC

## 2022-08-27 ENCOUNTER — Other Ambulatory Visit: Payer: Self-pay

## 2022-08-27 NOTE — Telephone Encounter (Signed)
Please advise that I do not fill for chronic pain medication she will either have to f/u with orthopedist and or see pain management for this.

## 2022-08-28 ENCOUNTER — Other Ambulatory Visit (INDEPENDENT_AMBULATORY_CARE_PROVIDER_SITE_OTHER): Payer: BC Managed Care – PPO

## 2022-08-28 DIAGNOSIS — E781 Pure hyperglyceridemia: Secondary | ICD-10-CM

## 2022-08-28 DIAGNOSIS — E1165 Type 2 diabetes mellitus with hyperglycemia: Secondary | ICD-10-CM | POA: Diagnosis not present

## 2022-08-28 DIAGNOSIS — E538 Deficiency of other specified B group vitamins: Secondary | ICD-10-CM | POA: Diagnosis not present

## 2022-08-28 LAB — LIPID PANEL
Cholesterol: 149 mg/dL (ref 0–200)
HDL: 32.8 mg/dL — ABNORMAL LOW (ref >39.00)
NonHDL: 116.67
Total CHOL/HDL Ratio: 5
Triglycerides: 201 mg/dL — ABNORMAL HIGH (ref 0.0–149.0)
VLDL: 40.2 mg/dL — ABNORMAL HIGH (ref 0.0–40.0)

## 2022-08-28 LAB — HEMOGLOBIN A1C: Hgb A1c MFr Bld: 7.1 % — ABNORMAL HIGH (ref 4.6–6.5)

## 2022-08-28 LAB — LDL CHOLESTEROL, DIRECT: Direct LDL: 98 mg/dL

## 2022-08-28 LAB — B12 AND FOLATE PANEL
Folate: 5.9 ng/mL — ABNORMAL LOW (ref >5.9)
Vitamin B-12: 272 pg/mL (ref 211–911)

## 2022-09-01 ENCOUNTER — Other Ambulatory Visit: Payer: Self-pay | Admitting: Family

## 2022-09-01 DIAGNOSIS — E1165 Type 2 diabetes mellitus with hyperglycemia: Secondary | ICD-10-CM

## 2022-09-01 DIAGNOSIS — E781 Pure hyperglyceridemia: Secondary | ICD-10-CM

## 2022-09-03 NOTE — Progress Notes (Signed)
Improvement with triglycerides which is good! Still could increase good cholesterol such as good fats like nuts, seeds and or avocados.  Diabetes becoming more and more controlled which is great. Less than 7 is desired, but A1c is now 7.1 which is great.  B12 still on lower end of normal, are we taking daily b12 and or daily folate? If not we need to take daily folic acid and daily b12 (1000 mcg )

## 2022-09-05 ENCOUNTER — Other Ambulatory Visit: Payer: Self-pay

## 2022-09-05 ENCOUNTER — Other Ambulatory Visit: Payer: Self-pay | Admitting: Family

## 2022-09-05 DIAGNOSIS — E538 Deficiency of other specified B group vitamins: Secondary | ICD-10-CM

## 2022-09-05 MED ORDER — FOLIC ACID 1 MG PO TABS
1.0000 mg | ORAL_TABLET | Freq: Every day | ORAL | 1 refills | Status: DC
  Filled 2022-09-05: qty 30, 30d supply, fill #0

## 2022-09-05 NOTE — Progress Notes (Signed)
Refilled

## 2022-09-09 ENCOUNTER — Other Ambulatory Visit: Payer: Self-pay

## 2022-09-09 DIAGNOSIS — E538 Deficiency of other specified B group vitamins: Secondary | ICD-10-CM

## 2022-09-09 MED ORDER — FOLIC ACID 1 MG PO TABS: 1.0000 mg | ORAL_TABLET | Freq: Every day | ORAL | 1 refills | Status: DC

## 2022-10-07 ENCOUNTER — Other Ambulatory Visit: Payer: Self-pay | Admitting: Family

## 2022-10-07 DIAGNOSIS — E781 Pure hyperglyceridemia: Secondary | ICD-10-CM

## 2022-10-07 DIAGNOSIS — E1165 Type 2 diabetes mellitus with hyperglycemia: Secondary | ICD-10-CM

## 2022-10-28 ENCOUNTER — Other Ambulatory Visit: Payer: Self-pay

## 2022-10-28 ENCOUNTER — Telehealth: Payer: Self-pay | Admitting: Family

## 2022-10-28 DIAGNOSIS — E1165 Type 2 diabetes mellitus with hyperglycemia: Secondary | ICD-10-CM

## 2022-10-28 DIAGNOSIS — E781 Pure hyperglyceridemia: Secondary | ICD-10-CM

## 2022-10-28 MED ORDER — TRULICITY 0.75 MG/0.5ML ~~LOC~~ SOAJ
0.7500 mg | SUBCUTANEOUS | 2 refills | Status: DC
  Filled 2022-10-28: qty 2, 28d supply, fill #0

## 2022-10-28 NOTE — Telephone Encounter (Addendum)
Called patient advised of change. She requested be sent to [facility]. I have resent as requested.

## 2022-10-28 NOTE — Telephone Encounter (Signed)
Sending in trulicity to see if this is covered instead.  Let me know.

## 2022-10-28 NOTE — Telephone Encounter (Signed)
Pt called in stating she contacted her insurance company to see what meds would be similar to "Ozempic 0.5mg " yet still affordable. Pt sates she was told the monthly cost for the meds weekly is $100. Pt states her insurance stated they can't give out any other prices for other alternative meds until a med is chosen by [employer] along with a prior authorization for the meds. Pt stated she currently taking the meds, glipizide 5mg  (twice a day) & metformin 500mg  (2 tablets twice daily). Pt states for the last few weeks, her FBS has been between 200-220, on 10/28/22 it was 176. Preferred pharmacy is [id number] [address] [place] - [address]. Call back # [phone]

## 2022-10-28 NOTE — Addendum Note (Signed)
Addended by: Eugenia Pancoast on: 10/28/2022 01:00 PM   Modules accepted: Orders

## 2022-11-24 ENCOUNTER — Other Ambulatory Visit: Payer: Self-pay | Admitting: Family

## 2022-11-24 DIAGNOSIS — E1165 Type 2 diabetes mellitus with hyperglycemia: Secondary | ICD-10-CM

## 2022-11-30 ENCOUNTER — Other Ambulatory Visit: Payer: Self-pay | Admitting: Family

## 2022-11-30 DIAGNOSIS — E1165 Type 2 diabetes mellitus with hyperglycemia: Secondary | ICD-10-CM

## 2022-11-30 DIAGNOSIS — E781 Pure hyperglyceridemia: Secondary | ICD-10-CM

## 2022-11-30 DIAGNOSIS — E538 Deficiency of other specified B group vitamins: Secondary | ICD-10-CM

## 2022-12-02 NOTE — Telephone Encounter (Signed)
Sending in refills however please let pt know due for six month f/u in april. Have pt come fasting for labs as well.

## 2022-12-02 NOTE — Telephone Encounter (Signed)
Refill req for: folic acid (FOLVITE) 1 MG tablet ( 90tab/no refills) glipiZIDE (GLUCOTROL) 5 MG tablet ( 60tabs/no refills) metFORMIN (GLUCOPHAGE) 500 MG tablet  (180tabs/no refills) omega-3 acid ethyl esters (LOVAZA) 1 g capsule (120caps/no refills)  LV- 08/06/22 LR- 10/26/22 NV- not scheduled

## 2022-12-03 NOTE — Telephone Encounter (Signed)
Spoke with the patient and advised as instructed. Pt was scheduled for f/u appt in April.

## 2022-12-10 ENCOUNTER — Other Ambulatory Visit: Payer: Self-pay

## 2022-12-27 ENCOUNTER — Other Ambulatory Visit: Payer: Self-pay | Admitting: Family

## 2022-12-27 DIAGNOSIS — E781 Pure hyperglyceridemia: Secondary | ICD-10-CM

## 2023-01-14 ENCOUNTER — Other Ambulatory Visit: Payer: Self-pay | Admitting: Family

## 2023-01-14 ENCOUNTER — Ambulatory Visit (INDEPENDENT_AMBULATORY_CARE_PROVIDER_SITE_OTHER): Payer: BC Managed Care – PPO | Admitting: Family

## 2023-01-14 ENCOUNTER — Encounter: Payer: Self-pay | Admitting: Family

## 2023-01-14 VITALS — BP 128/78 | HR 88 | Temp 97.8°F | Ht 64.0 in | Wt 316.0 lb

## 2023-01-14 DIAGNOSIS — E781 Pure hyperglyceridemia: Secondary | ICD-10-CM

## 2023-01-14 DIAGNOSIS — R748 Abnormal levels of other serum enzymes: Secondary | ICD-10-CM | POA: Diagnosis not present

## 2023-01-14 DIAGNOSIS — E871 Hypo-osmolality and hyponatremia: Secondary | ICD-10-CM

## 2023-01-14 DIAGNOSIS — E538 Deficiency of other specified B group vitamins: Secondary | ICD-10-CM | POA: Diagnosis not present

## 2023-01-14 DIAGNOSIS — E1165 Type 2 diabetes mellitus with hyperglycemia: Secondary | ICD-10-CM

## 2023-01-14 DIAGNOSIS — R7989 Other specified abnormal findings of blood chemistry: Secondary | ICD-10-CM

## 2023-01-14 DIAGNOSIS — E782 Mixed hyperlipidemia: Secondary | ICD-10-CM

## 2023-01-14 LAB — COMPREHENSIVE METABOLIC PANEL
ALT: 46 U/L — ABNORMAL HIGH (ref 0–35)
AST: 18 U/L (ref 0–37)
Albumin: 4 g/dL (ref 3.5–5.2)
Alkaline Phosphatase: 90 U/L (ref 39–117)
BUN: 8 mg/dL (ref 6–23)
CO2: 25 mEq/L (ref 19–32)
Calcium: 8.9 mg/dL (ref 8.4–10.5)
Chloride: 102 mEq/L (ref 96–112)
Creatinine, Ser: 0.61 mg/dL (ref 0.40–1.20)
GFR: 119.85 mL/min (ref >60.00)
Glucose, Bld: 145 mg/dL — ABNORMAL HIGH (ref 70–99)
Potassium: 4.2 mEq/L (ref 3.5–5.1)
Sodium: 136 mEq/L (ref 135–145)
Total Bilirubin: 0.4 mg/dL (ref 0.2–1.2)
Total Protein: 6.8 g/dL (ref 6.0–8.3)

## 2023-01-14 LAB — LIPID PANEL
Cholesterol: 161 mg/dL (ref 0–200)
HDL: 32.4 mg/dL — ABNORMAL LOW (ref >39.00)
NonHDL: 128.13
Total CHOL/HDL Ratio: 5
Triglycerides: 224 mg/dL — ABNORMAL HIGH (ref 0.0–149.0)
VLDL: 44.8 mg/dL — ABNORMAL HIGH (ref 0.0–40.0)

## 2023-01-14 LAB — MICROALBUMIN / CREATININE URINE RATIO
Creatinine,U: 60.9 mg/dL
Microalb Creat Ratio: 1.1 mg/g (ref 0.0–30.0)
Microalb, Ur: <0.7 mg/dL (ref 0.0–1.9)

## 2023-01-14 LAB — HEMOGLOBIN A1C: Hgb A1c MFr Bld: 7.1 % — ABNORMAL HIGH (ref 4.6–6.5)

## 2023-01-14 LAB — LDL CHOLESTEROL, DIRECT: Direct LDL: 99 mg/dL

## 2023-01-14 LAB — B12 AND FOLATE PANEL
Folate: 8.8 ng/mL (ref >5.9)
Vitamin B-12: 258 pg/mL (ref 211–911)

## 2023-01-14 MED ORDER — TRULICITY 1.5 MG/0.5ML ~~LOC~~ SOAJ
1.5000 mg | SUBCUTANEOUS | 2 refills | Status: DC
Start: 2023-01-14 — End: 2023-04-21

## 2023-01-14 MED ORDER — METFORMIN HCL 1000 MG PO TABS: 1000.0000 mg | ORAL_TABLET | Freq: Two times a day (BID) | ORAL | 3 refills | Status: DC

## 2023-01-14 MED ORDER — ATORVASTATIN CALCIUM 20 MG PO TABS: 20.0000 mg | ORAL_TABLET | Freq: Every day | ORAL | 3 refills | Status: DC

## 2023-01-14 NOTE — Assessment & Plan Note (Signed)
Ordered hga1c and urine microalbumin today pending results. Work on diabetic diet and exercise as tolerated. Yearly foot exam, and annual eye exam.   

## 2023-01-14 NOTE — Progress Notes (Signed)
Established Patient Office Visit  Subjective:      CC:  Chief Complaint  Patient presents with   Medical Management of Chronic Issues    HPI: DOE Christina Cobb is a 32 y.o. female presenting on 01/14/2023 for Medical Management of Chronic Issues . DM2: trending down. Working on diet when she can. Exercise: cleaning front yard . Taking glipizide 5 mg twice daily  and metformin 500 mg two tablets twice daily , also taking Trulicity 0.75 mg weekly  Lab Results  Component Value Date   HGBA1C 7.1 (H) 08/28/2022   Hyperlipidemia: some slight improvement of triglycerides . On atorvastatin 10 mg once daily and lavaza  Lab Results  Component Value Date   CHOL 149 08/28/2022   HDL 32.80 (L) 08/28/2022   LDLCALC 120 (H) 05/12/2013   LDLDIRECT 98.0 08/28/2022   TRIG 201.0 (H) 08/28/2022   CHOLHDL 5 08/28/2022     Vitamin b12 def: taking 1000  mcg once daily of b12.  Folic def last visit: still taking it daily.        Social history:  Relevant past medical, surgical, family and social history reviewed and updated as indicated. Interim medical history since our last visit reviewed.  Allergies and medications reviewed and updated.  DATA REVIEWED: CHART IN EPIC     ROS: Negative unless specifically indicated above in HPI.    Current Outpatient Medications:    atorvastatin (LIPITOR) 10 MG tablet, Take 1 tablet (10 mg total) by mouth daily., Disp: 90 tablet, Rfl: 3   Continuous Blood Gluc Sensor (DEXCOM G7 SENSOR) MISC, APPLY SENSOR EVERY 10 DAYS TO MONITOR GLUCOSE CONTINUOUSLY, Disp: 12 each, Rfl: 0   Dulaglutide (TRULICITY) 0.75 MG/0.5ML SOPN, Inject 0.75 mg into the skin once a week., Disp: 2 mL, Rfl: 2   folic acid (FOLVITE) 1 MG tablet, Take 1 tablet by mouth once daily, Disp: 90 tablet, Rfl: 0   glipiZIDE (GLUCOTROL) 5 MG tablet, TAKE 1 TABLET BY MOUTH TWICE DAILY BEFORE A MEAL, Disp: 60 tablet, Rfl: 0   metFORMIN (GLUCOPHAGE) 1000 MG tablet, Take 1 tablet (1,000 mg  total) by mouth 2 (two) times daily with a meal., Disp: 180 tablet, Rfl: 3   omega-3 acid ethyl esters (LOVAZA) 1 g capsule, Take 2 capsules by mouth twice daily, Disp: 120 capsule, Rfl: 0   traMADol (ULTRAM) 50 MG tablet, Take 2 tablet by mouth every eight hours as needed for pain, Disp: 40 tablet, Rfl: 0      Objective:    BP 128/78   Pulse 88   Temp 97.8 F (36.6 C) (Temporal)   Ht 5\' 4"  (1.626 m)   Wt (!) 316 lb (143.3 kg)   SpO2 98%   BMI 54.24 kg/m   Wt Readings from Last 3 Encounters:  01/14/23 (!) 316 lb (143.3 kg)  08/06/22 (!) 303 lb 4 oz (137.6 kg)  05/21/22 (!) 304 lb 2 oz (138 kg)    Physical Exam  Physical Exam Constitutional:      General: not in acute distress.    Appearance: Normal appearance. normal weight. is not ill-appearing, toxic-appearing or diaphoretic.  Cardiovascular:     Rate and Rhythm: Normal rate.  Pulmonary:     Effort: Pulmonary effort is normal.  Musculoskeletal:        General: Normal range of motion.  Neurological:     General: No focal deficit present.     Mental Status: alert and oriented to person, place, and time. Mental status  is at baseline.  Psychiatric:        Mood and Affect: Mood normal.        Behavior: Behavior normal.        Thought Content: Thought content normal.        Judgment: Judgment normal.        Assessment & Plan:  Type 2 diabetes mellitus with hyperglycemia, without long-term current use of insulin Assessment & Plan: Ordered hga1c and urine microalbumin today pending results. Work on diabetic diet and exercise as tolerated. Yearly foot exam, and annual eye exam.    Orders: -     metFORMIN HCl; Take 1 tablet (1,000 mg total) by mouth 2 (two) times daily with a meal.  Dispense: 180 tablet; Refill: 3 -     Hemoglobin A1c -     Microalbumin / creatinine urine ratio  Hyponatremia Assessment & Plan: Ordering cmp pending  Orders: -     Comprehensive metabolic panel  Elevated alkaline phosphatase  level -     Comprehensive metabolic panel  B12 deficiency Assessment & Plan: Continue b12 and folic acid Pending results of labs today   Orders: -     B12 and Folate Panel  Folate deficiency -     B12 and Folate Panel  Hypertriglyceridemia Assessment & Plan: Continue lovasa  Repeat lipid panel today  Orders: -     Lipid panel  Elevated LFTs Assessment & Plan: Repeat today      Return in about 6 months (around 07/16/2023) for f/u diabetes.  Mort Sawyers, MSN, APRN, FNP-C [facility]

## 2023-01-14 NOTE — Patient Instructions (Signed)
  Stop by the lab prior to leaving today. I will notify you of your results once received.    Regards,   Edia Pursifull FNP-C  

## 2023-01-14 NOTE — Assessment & Plan Note (Signed)
Continue b12 and folic acid Pending results of labs today

## 2023-01-14 NOTE — Assessment & Plan Note (Signed)
Continue lovasa  Repeat lipid panel today

## 2023-01-14 NOTE — Progress Notes (Signed)
Triglycerides inching up  Let's increase atorvastatin to 20 mg once daily from 10 mg.  A1c really hasn't changed much since 4 months ago.  B12 still on lower end, are we taking otc b12? Is she tolerating trulicity? I want to increase to 1.5 mg weekly.  Have pt schedule lab only appt in three months, come fasting.  Otherwise 6 month f/u in office.

## 2023-01-14 NOTE — Assessment & Plan Note (Signed)
Ordering cmp pending

## 2023-01-14 NOTE — Assessment & Plan Note (Signed)
Repeat today

## 2023-01-15 NOTE — Progress Notes (Signed)
noted 

## 2023-01-19 ENCOUNTER — Other Ambulatory Visit: Payer: Self-pay | Admitting: Family

## 2023-01-19 DIAGNOSIS — E1165 Type 2 diabetes mellitus with hyperglycemia: Secondary | ICD-10-CM

## 2023-02-10 ENCOUNTER — Other Ambulatory Visit: Payer: Self-pay | Admitting: Family

## 2023-02-10 DIAGNOSIS — E781 Pure hyperglyceridemia: Secondary | ICD-10-CM

## 2023-03-19 ENCOUNTER — Other Ambulatory Visit: Payer: Self-pay | Admitting: Family

## 2023-03-19 DIAGNOSIS — E781 Pure hyperglyceridemia: Secondary | ICD-10-CM

## 2023-04-15 ENCOUNTER — Other Ambulatory Visit (INDEPENDENT_AMBULATORY_CARE_PROVIDER_SITE_OTHER): Payer: BC Managed Care – PPO

## 2023-04-15 DIAGNOSIS — E782 Mixed hyperlipidemia: Secondary | ICD-10-CM

## 2023-04-15 DIAGNOSIS — E1165 Type 2 diabetes mellitus with hyperglycemia: Secondary | ICD-10-CM | POA: Diagnosis not present

## 2023-04-15 LAB — LIPID PANEL
Cholesterol: 128 mg/dL (ref 0–200)
HDL: 26.7 mg/dL — ABNORMAL LOW (ref >39.00)
LDL Cholesterol: 73 mg/dL (ref 0–99)
NonHDL: 101.13
Total CHOL/HDL Ratio: 5
Triglycerides: 140 mg/dL (ref 0.0–149.0)
VLDL: 28 mg/dL (ref 0.0–40.0)

## 2023-04-15 LAB — HEMOGLOBIN A1C: Hgb A1c MFr Bld: 6.2 % (ref 4.6–6.5)

## 2023-04-21 ENCOUNTER — Other Ambulatory Visit: Payer: Self-pay | Admitting: Family

## 2023-04-21 DIAGNOSIS — E1165 Type 2 diabetes mellitus with hyperglycemia: Secondary | ICD-10-CM

## 2023-05-06 ENCOUNTER — Other Ambulatory Visit: Payer: Self-pay | Admitting: Family

## 2023-05-06 DIAGNOSIS — E781 Pure hyperglyceridemia: Secondary | ICD-10-CM

## 2023-05-14 ENCOUNTER — Encounter: Payer: Self-pay | Admitting: Internal Medicine

## 2023-05-14 ENCOUNTER — Telehealth (INDEPENDENT_AMBULATORY_CARE_PROVIDER_SITE_OTHER): Payer: BC Managed Care – PPO | Admitting: Internal Medicine

## 2023-05-14 VITALS — Wt 304.0 lb

## 2023-05-14 DIAGNOSIS — U071 COVID-19: Secondary | ICD-10-CM | POA: Diagnosis not present

## 2023-05-14 NOTE — Assessment & Plan Note (Signed)
Classic presentation with direct household exposure Doesn't really need the augmentin---sinus infection is from this Analgesics Discussed antivirals--not really indicated since she is low risk Will need to be out of work till 8/12--she has form to be done ER if sig worsening of SOB

## 2023-05-14 NOTE — Progress Notes (Signed)
Subjective:    Patient ID: Christina Cobb, female    DOB: 08-Jun-1991, 32 y.o.   MRN: [id number]  HPI Video virtual visit for possible COVID infection Identification done Discussed limitations and billing and she gave consent Participants--patient in her home and I am in my office  Started yesterday morning with bad headache (made eyes hurt) Blowing nose a lot and stopped up Coughing--and gets SOB with walking Bad muscle pains Some SOB even at rest--no wheezing May have fever--felt hot then cold last night  Took augmentin yesterday--thought she had sinus infection  Mom tested positive for COVID yesterday Current Outpatient Medications on File Prior to Visit  Medication Sig Dispense Refill   atorvastatin (LIPITOR) 20 MG tablet Take 1 tablet (20 mg total) by mouth daily. 90 tablet 3   Continuous Blood Gluc Sensor (DEXCOM G7 SENSOR) MISC APPLY SENSOR EVERY 10 DAYS TO MONITOR GLUCOSE CONTINUOUSLY 12 each 0   folic acid (FOLVITE) 1 MG tablet Take 1 tablet by mouth once daily 90 tablet 0   glipiZIDE (GLUCOTROL) 5 MG tablet TAKE 1 TABLET BY MOUTH TWICE DAILY BEFORE A MEAL 180 tablet 3   metFORMIN (GLUCOPHAGE) 1000 MG tablet Take 1 tablet (1,000 mg total) by mouth 2 (two) times daily with a meal. 180 tablet 3   omega-3 acid ethyl esters (LOVAZA) 1 g capsule Take 2 capsules by mouth twice daily 120 capsule 0   TRULICITY 1.5 MG/0.5ML SOPN INJECT 1.5 MG INTO THE SKIN ONCE A WEEK 4 mL 0   No current facility-administered medications on file prior to visit.    No Known Allergies  Past Medical History:  Diagnosis Date   GERD (gastroesophageal reflux disease)    Legg-Perthes disease     Past Surgical History:  Procedure Laterality Date   HIP SURGERY Left 1998   due to Legg Prethes Disease   TONSILLECTOMY AND ADENOIDECTOMY  1997    Family History  Adopted: Yes    Social History   Socioeconomic History   Marital status: Single    Spouse name: Not on file   Number of  children: 0   Years of education: Not on file   Highest education level: Bachelor's degree (e.g., BA, AB, BS)  Occupational History   Occupation: [employer]  Tobacco Use   Smoking status: Never   Smokeless tobacco: Never  Vaping Use   Vaping status: Never Used  Substance and Sexual Activity   Alcohol use: No   Drug use: No   Sexual activity: Never  Other Topics Concern   Not on file  Social History Narrative   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    Social Determinants of Health   Financial Resource Strain: Low Risk  (01/13/2023)   Overall Financial Resource Strain (CARDIA)    Difficulty of Paying Living Expenses: Not hard at all  Food Insecurity: No Food Insecurity (01/13/2023)   Hunger Vital Sign    Worried About Running Out of Food in the Last Year: Never true    Ran Out of Food in the Last Year: Never true  Transportation Needs: No Transportation Needs (01/13/2023)   PRAPARE - Administrator, Civil Service (Medical): No    Lack of Transportation (Non-Medical): No  Physical Activity: Unknown (01/13/2023)   Exercise Vital Sign    Days of Exercise per Week: Patient declined    Minutes of Exercise per Session: Not on file  Stress: No Stress Concern Present (01/13/2023)   [employer] of Occupational  Health - Occupational Stress Questionnaire    Feeling of Stress : Not at all  Social Connections: Moderately Isolated (01/13/2023)   Social Connection and Isolation Panel [NHANES]    Frequency of Communication with Friends and Family: More than three times a week    Frequency of Social Gatherings with Friends and Family: Never    Attends Religious Services: 1 to 4 times per year    Active Member of [employer] or Organizations: No    Attends Engineer, structural: Not on file    Marital Status: Never married  Catering manager Violence: Not on file    Review of Systems Can't smell now--relates to the congestion No appetite  Drinking okay No  N/V    Objective:   Physical Exam Constitutional:      Comments: Looks mildly ill but no distress  Pulmonary:     Effort: Pulmonary effort is normal. No respiratory distress.  Neurological:     Mental Status: She is alert.            Assessment & Plan:

## 2023-05-18 ENCOUNTER — Other Ambulatory Visit: Payer: Self-pay | Admitting: Family

## 2023-05-18 DIAGNOSIS — E1165 Type 2 diabetes mellitus with hyperglycemia: Secondary | ICD-10-CM

## 2023-05-22 ENCOUNTER — Other Ambulatory Visit: Payer: Self-pay | Admitting: Family

## 2023-05-22 DIAGNOSIS — E1165 Type 2 diabetes mellitus with hyperglycemia: Secondary | ICD-10-CM

## 2023-06-17 ENCOUNTER — Other Ambulatory Visit: Payer: Self-pay | Admitting: Family

## 2023-06-17 DIAGNOSIS — E781 Pure hyperglyceridemia: Secondary | ICD-10-CM

## 2023-06-26 ENCOUNTER — Other Ambulatory Visit: Payer: Self-pay | Admitting: Family

## 2023-06-26 DIAGNOSIS — E1165 Type 2 diabetes mellitus with hyperglycemia: Secondary | ICD-10-CM

## 2023-07-14 ENCOUNTER — Ambulatory Visit: Payer: BC Managed Care – PPO | Admitting: Family

## 2023-07-16 ENCOUNTER — Other Ambulatory Visit: Payer: Self-pay | Admitting: Family

## 2023-07-16 DIAGNOSIS — E781 Pure hyperglyceridemia: Secondary | ICD-10-CM

## 2023-07-18 ENCOUNTER — Other Ambulatory Visit: Payer: Self-pay | Admitting: Family

## 2023-07-18 DIAGNOSIS — E781 Pure hyperglyceridemia: Secondary | ICD-10-CM

## 2023-07-21 ENCOUNTER — Ambulatory Visit: Payer: BC Managed Care – PPO | Admitting: Family

## 2023-07-21 VITALS — BP 124/86 | HR 68 | Ht 64.25 in | Wt 311.8 lb

## 2023-07-21 DIAGNOSIS — E1165 Type 2 diabetes mellitus with hyperglycemia: Secondary | ICD-10-CM

## 2023-07-21 DIAGNOSIS — R7989 Other specified abnormal findings of blood chemistry: Secondary | ICD-10-CM

## 2023-07-21 DIAGNOSIS — E063 Autoimmune thyroiditis: Secondary | ICD-10-CM

## 2023-07-21 DIAGNOSIS — Z23 Encounter for immunization: Secondary | ICD-10-CM | POA: Diagnosis not present

## 2023-07-21 DIAGNOSIS — E782 Mixed hyperlipidemia: Secondary | ICD-10-CM

## 2023-07-21 DIAGNOSIS — Z7984 Long term (current) use of oral hypoglycemic drugs: Secondary | ICD-10-CM

## 2023-07-21 DIAGNOSIS — E781 Pure hyperglyceridemia: Secondary | ICD-10-CM

## 2023-07-21 DIAGNOSIS — L409 Psoriasis, unspecified: Secondary | ICD-10-CM

## 2023-07-21 DIAGNOSIS — Z7985 Long-term (current) use of injectable non-insulin antidiabetic drugs: Secondary | ICD-10-CM

## 2023-07-21 LAB — COMPREHENSIVE METABOLIC PANEL
ALT: 19 U/L (ref 0–35)
AST: 13 U/L (ref 0–37)
Albumin: 3.9 g/dL (ref 3.5–5.2)
Alkaline Phosphatase: 64 U/L (ref 39–117)
BUN: 15 mg/dL (ref 6–23)
CO2: 26 meq/L (ref 19–32)
Calcium: 9.3 mg/dL (ref 8.4–10.5)
Chloride: 106 meq/L (ref 96–112)
Creatinine, Ser: 0.61 mg/dL (ref 0.40–1.20)
GFR: 119.41 mL/min (ref >60.00)
Glucose, Bld: 121 mg/dL — ABNORMAL HIGH (ref 70–99)
Potassium: 4.1 meq/L (ref 3.5–5.1)
Sodium: 138 meq/L (ref 135–145)
Total Bilirubin: 0.3 mg/dL (ref 0.2–1.2)
Total Protein: 6.3 g/dL (ref 6.0–8.3)

## 2023-07-21 LAB — LIPID PANEL
Cholesterol: 114 mg/dL (ref 0–200)
HDL: 28 mg/dL — ABNORMAL LOW (ref >39.00)
LDL Cholesterol: 69 mg/dL (ref 0–99)
NonHDL: 85.52
Total CHOL/HDL Ratio: 4
Triglycerides: 82 mg/dL (ref 0.0–149.0)
VLDL: 16.4 mg/dL (ref 0.0–40.0)

## 2023-07-21 LAB — TSH: TSH: 2.61 u[IU]/mL (ref 0.35–5.50)

## 2023-07-21 LAB — HEMOGLOBIN A1C: Hgb A1c MFr Bld: 5.9 % (ref 4.6–6.5)

## 2023-07-21 MED ORDER — CLOBETASOL PROPIONATE 0.05 % EX CREA
1.0000 | TOPICAL_CREAM | Freq: Two times a day (BID) | CUTANEOUS | 0 refills | Status: AC
Start: 2023-07-21 — End: ?

## 2023-07-21 NOTE — Assessment & Plan Note (Signed)
Pt advised to work on diet and exercise as tolerated

## 2023-07-21 NOTE — Progress Notes (Signed)
Established Patient Office Visit  Subjective:      CC:  Chief Complaint  Patient presents with   Medical Management of Chronic Issues    HPI: Christina Cobb is a 32 y.o. female presenting on 07/21/2023 for Medical Management of Chronic Issues . HLD: doing well on atorvastatin 20 mg once nightly. On lovasa as well which has been good for triglycerides.   DM2: on trulicity on 1.5 mg weekly as well as glipizide 5 mg BID and metformin 1000 mg BID Lab Results  Component Value Date   HGBA1C 6.2 04/15/2023   Low serum b12, taking daily.   Wt Readings from Last 3 Encounters:  07/21/23 (!) 311 lb 12.8 oz (141.4 kg)  05/14/23 (!) 304 lb (137.9 kg)  01/14/23 (!) 316 lb (143.3 kg)        Social history:  Relevant past medical, surgical, family and social history reviewed and updated as indicated. Interim medical history since our last visit reviewed.  Allergies and medications reviewed and updated.  DATA REVIEWED: CHART IN EPIC     ROS: Negative unless specifically indicated above in HPI.    Current Outpatient Medications:    clobetasol cream (TEMOVATE) 0.05 %, Apply 1 Application topically 2 (two) times daily., Disp: 30 g, Rfl: 0   cyanocobalamin (VITAMIN B12) 1000 MCG tablet, Take 1,000 mcg by mouth daily., Disp: , Rfl:    atorvastatin (LIPITOR) 20 MG tablet, Take 1 tablet (20 mg total) by mouth daily., Disp: 90 tablet, Rfl: 3   Continuous Glucose Sensor (DEXCOM G7 SENSOR) MISC, APPLY SENSOR EVERY 10 DAYS TO MONITOR GLUCOSE CONTINUOUSLY, Disp: 12 each, Rfl: 0   folic acid (FOLVITE) 1 MG tablet, Take 1 tablet by mouth once daily, Disp: 90 tablet, Rfl: 0   glipiZIDE (GLUCOTROL) 5 MG tablet, TAKE 1 TABLET BY MOUTH TWICE DAILY BEFORE A MEAL, Disp: 180 tablet, Rfl: 3   metFORMIN (GLUCOPHAGE) 1000 MG tablet, Take 1 tablet (1,000 mg total) by mouth 2 (two) times daily with a meal., Disp: 180 tablet, Rfl: 3   omega-3 acid ethyl esters (LOVAZA) 1 g capsule, Take 2 capsules  by mouth twice daily, Disp: 120 capsule, Rfl: 0   TRULICITY 1.5 MG/0.5ML SOPN, INJECT 1.5MG  INTO THE SKIN ONCE A WEEK, Disp: 4 mL, Rfl: 0      Objective:    BP 124/86 (BP Location: Left Arm, Patient Position: Sitting, Cuff Size: Large)   Pulse 68   Ht 5' 4.25" (1.632 m)   Wt (!) 311 lb 12.8 oz (141.4 kg)   SpO2 97%   BMI 53.10 kg/m   Wt Readings from Last 3 Encounters:  07/21/23 (!) 311 lb 12.8 oz (141.4 kg)  05/14/23 (!) 304 lb (137.9 kg)  01/14/23 (!) 316 lb (143.3 kg)    Physical Exam Constitutional:      General: She is not in acute distress.    Appearance: Normal appearance. She is obese. She is not ill-appearing, toxic-appearing or diaphoretic.  HENT:     Head: Normocephalic.  Cardiovascular:     Rate and Rhythm: Normal rate and regular rhythm.  Pulmonary:     Effort: Pulmonary effort is normal.  Musculoskeletal:        General: Normal range of motion.  Neurological:     General: No focal deficit present.     Mental Status: She is alert and oriented to person, place, and time. Mental status is at baseline.  Psychiatric:        Mood and  Affect: Mood normal.        Behavior: Behavior normal.        Thought Content: Thought content normal.        Judgment: Judgment normal.    Last metabolic panel Lab Results  Component Value Date   GLUCOSE 145 (H) 01/14/2023   NA 136 01/14/2023   K 4.2 01/14/2023   CL 102 01/14/2023   CO2 25 01/14/2023   BUN 8 01/14/2023   CREATININE 0.61 01/14/2023   GFR 119.85 01/14/2023   CALCIUM 8.9 01/14/2023   PROT 6.8 01/14/2023   ALBUMIN 4.0 01/14/2023   BILITOT 0.4 01/14/2023   ALKPHOS 90 01/14/2023   AST 18 01/14/2023   ALT 46 (H) 01/14/2023          Assessment & Plan:  Encounter for immunization -     Flu vaccine trivalent PF, 6mos and older(Flulaval,Afluria,Fluarix,Fluzone)  Type 2 diabetes mellitus with hyperglycemia, without long-term current use of insulin (HCC) Assessment & Plan: Ordered hga1c pending results.  Work on diabetic diet and exercise as tolerated. Yearly foot exam done today in office.    Orders: -     Hemoglobin A1c  Mixed hyperlipidemia -     Lipid panel  Hypertriglyceridemia  Hashimoto's thyroiditis Assessment & Plan: Tsh ordered pending results.  Orders: -     TSH  Psoriasis -     Clobetasol Propionate; Apply 1 Application topically 2 (two) times daily.  Dispense: 30 g; Refill: 0  Morbid obesity (HCC) Assessment & Plan: Pt advised to work on diet and exercise as tolerated    Elevated LFTs -     Comprehensive metabolic panel    Title   Diabetic Foot Exam - detailed Is there a history of foot ulcer?: No Is there a foot ulcer now?: No Is there swelling?: No Is there elevated skin temperature?: No Is there abnormal foot shape?: No Is there a claw toe deformity?: No Are the toenails long?: No Are the toenails thick?: No Are the toenails ingrown?: No Is the skin thin, fragile, shiny and hairless?": No Normal Range of Motion?: Yes Is there foot or ankle muscle weakness?: No Do you have pain in calf while walking?: No Are the shoes appropriate in style and fit?: Yes Can the patient see the bottom of their feet?: Yes Pulse Foot Exam completed.: Yes   Right Posterior Tibialis: Present Left posterior Tibialis: Present   Right Dorsalis Pedis: Present Left Dorsalis Pedis: Present     Semmes-Weinstein Monofilament Test "+" means "has sensation" and "-" means "no sensation"  R Foot Test Control: Pos L Foot Test Control: Pos   R Site 1-Great Toe: Pos L Site 1-Great Toe: Pos   R Site 4: Pos L Site 4: Pos   R site 5: Pos L Site 5: Pos  R Site 6: Pos L Site 6: Pos     Image components are not supported.   Image components are not supported. Image components are not supported.  [place] Comments      Return in about 6 months (around 01/19/2024) for f/u diabetes.  Mort Sawyers, MSN, APRN, FNP-C [facility]

## 2023-07-21 NOTE — Assessment & Plan Note (Signed)
Ordered hga1c pending results. Work on diabetic diet and exercise as tolerated. Yearly foot exam done today in office.

## 2023-07-21 NOTE — Assessment & Plan Note (Signed)
Tsh ordered pending results.

## 2023-07-28 ENCOUNTER — Other Ambulatory Visit: Payer: Self-pay | Admitting: Family

## 2023-07-28 DIAGNOSIS — E1165 Type 2 diabetes mellitus with hyperglycemia: Secondary | ICD-10-CM

## 2023-08-21 ENCOUNTER — Other Ambulatory Visit: Payer: Self-pay | Admitting: Family

## 2023-08-21 DIAGNOSIS — E1165 Type 2 diabetes mellitus with hyperglycemia: Secondary | ICD-10-CM

## 2023-09-24 ENCOUNTER — Other Ambulatory Visit: Payer: Self-pay | Admitting: Family

## 2023-09-24 DIAGNOSIS — E781 Pure hyperglyceridemia: Secondary | ICD-10-CM

## 2023-10-26 IMAGING — DX DG HIP (WITH OR WITHOUT PELVIS) 2-3V*L*
3 series · 3 of 3 positions shown · non-contrast
Comparison: None Available.

CLINICAL DATA: Left hip pain.

EXAM:
DG HIP (WITH OR WITHOUT PELVIS) 2-3V LEFT

[pelvis ap]
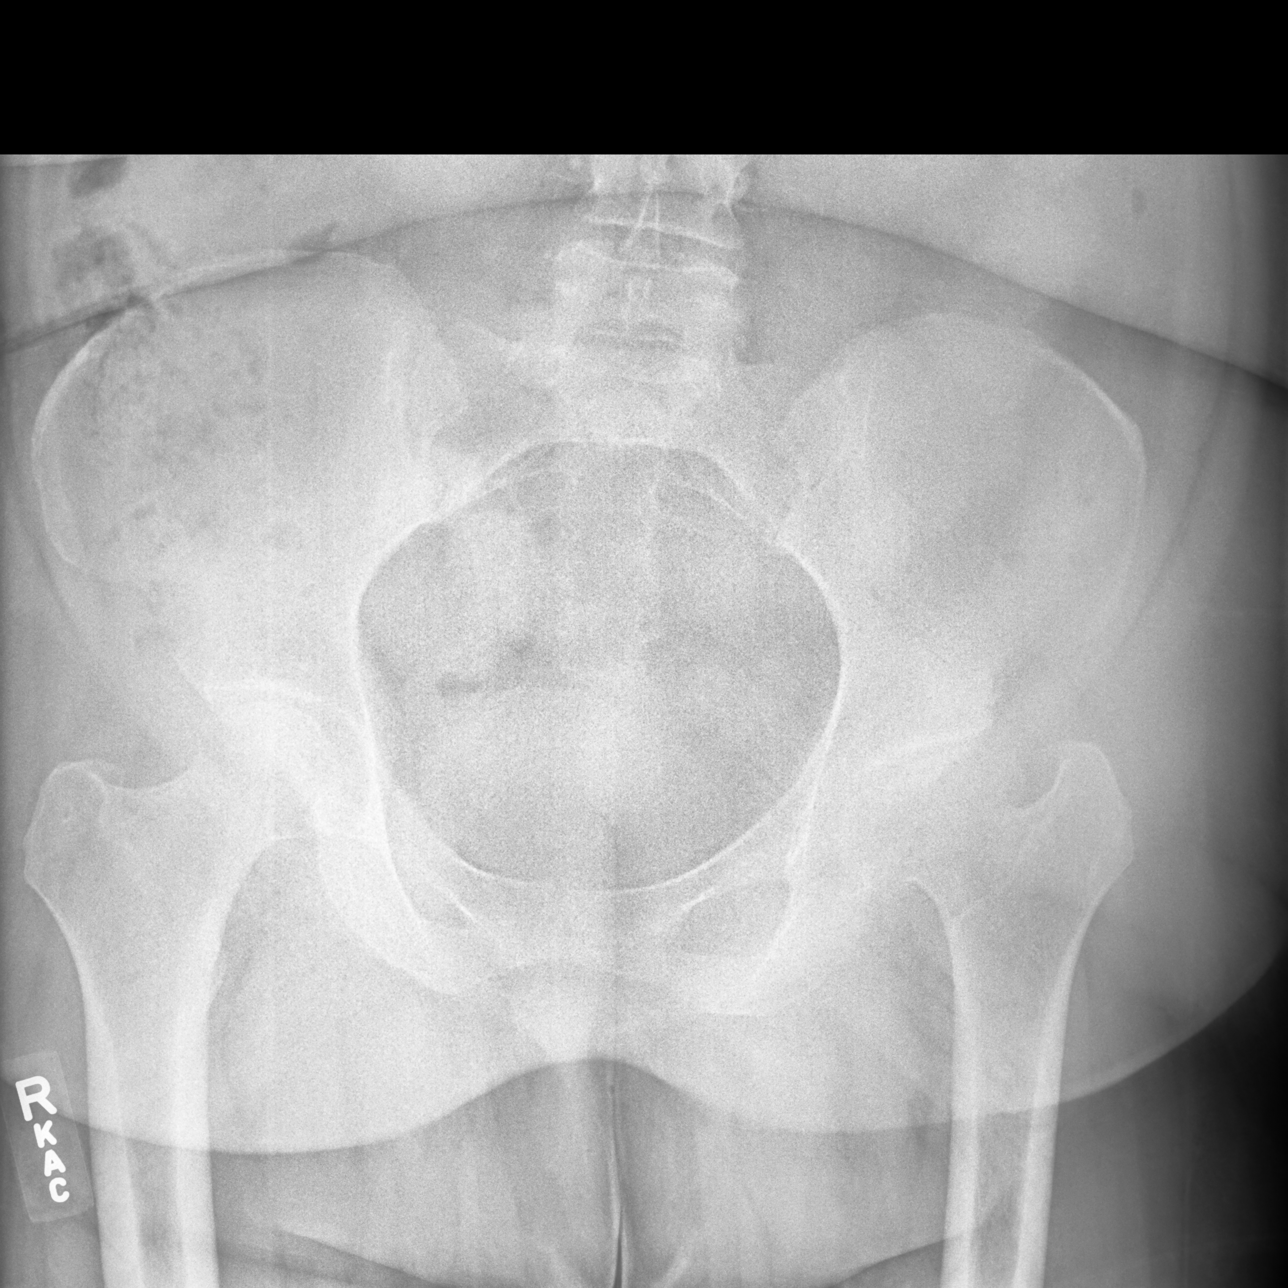

[hip ap]
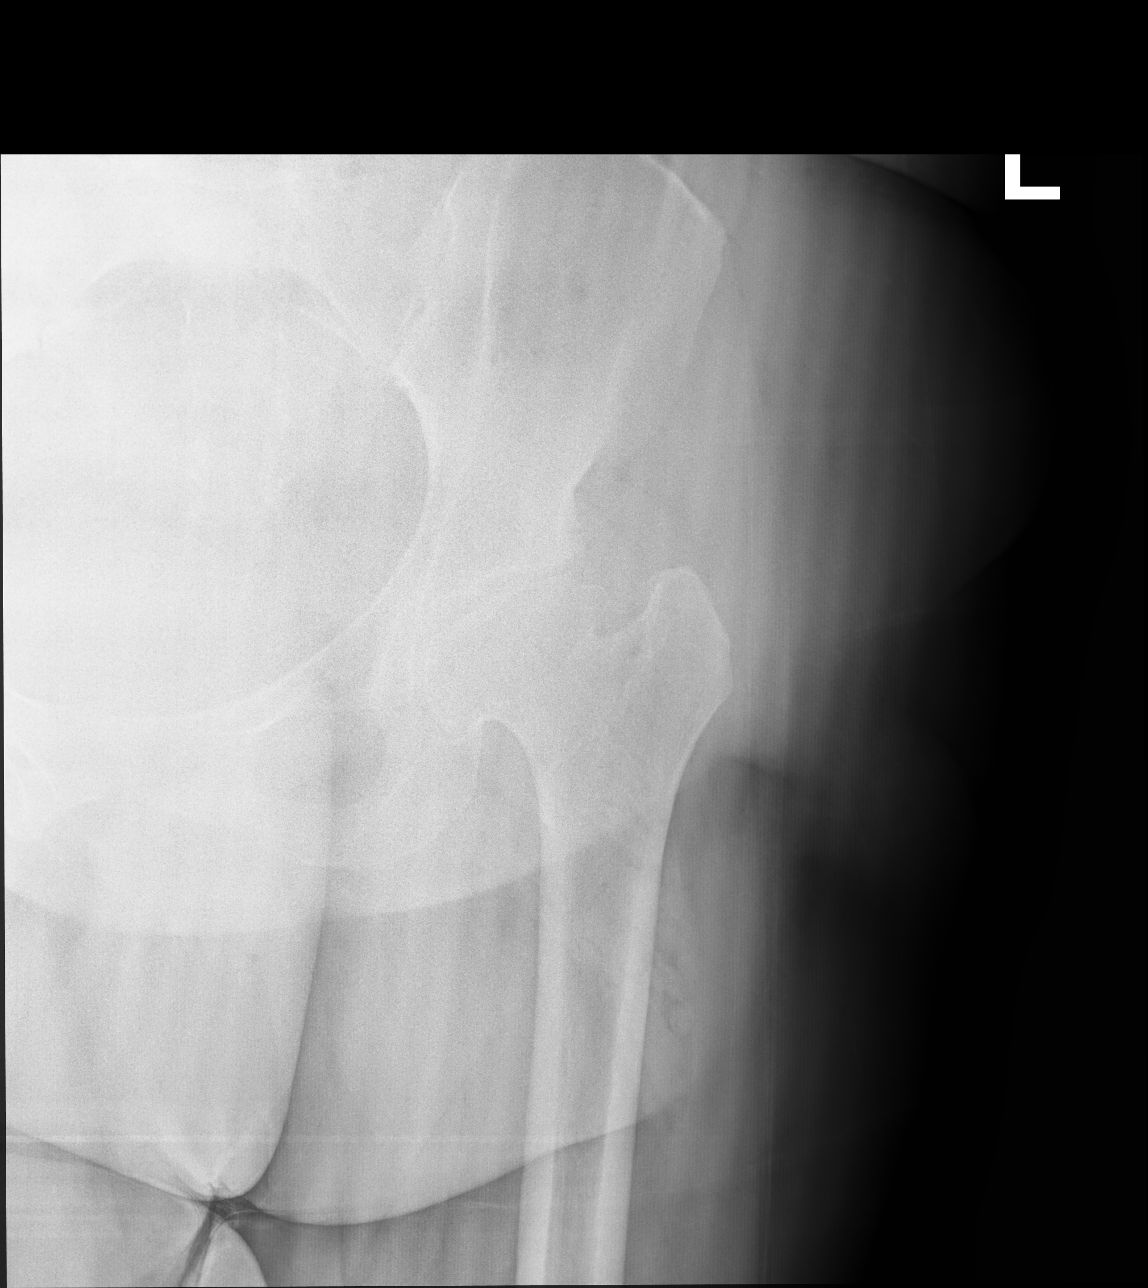

[hip (frog leg)]
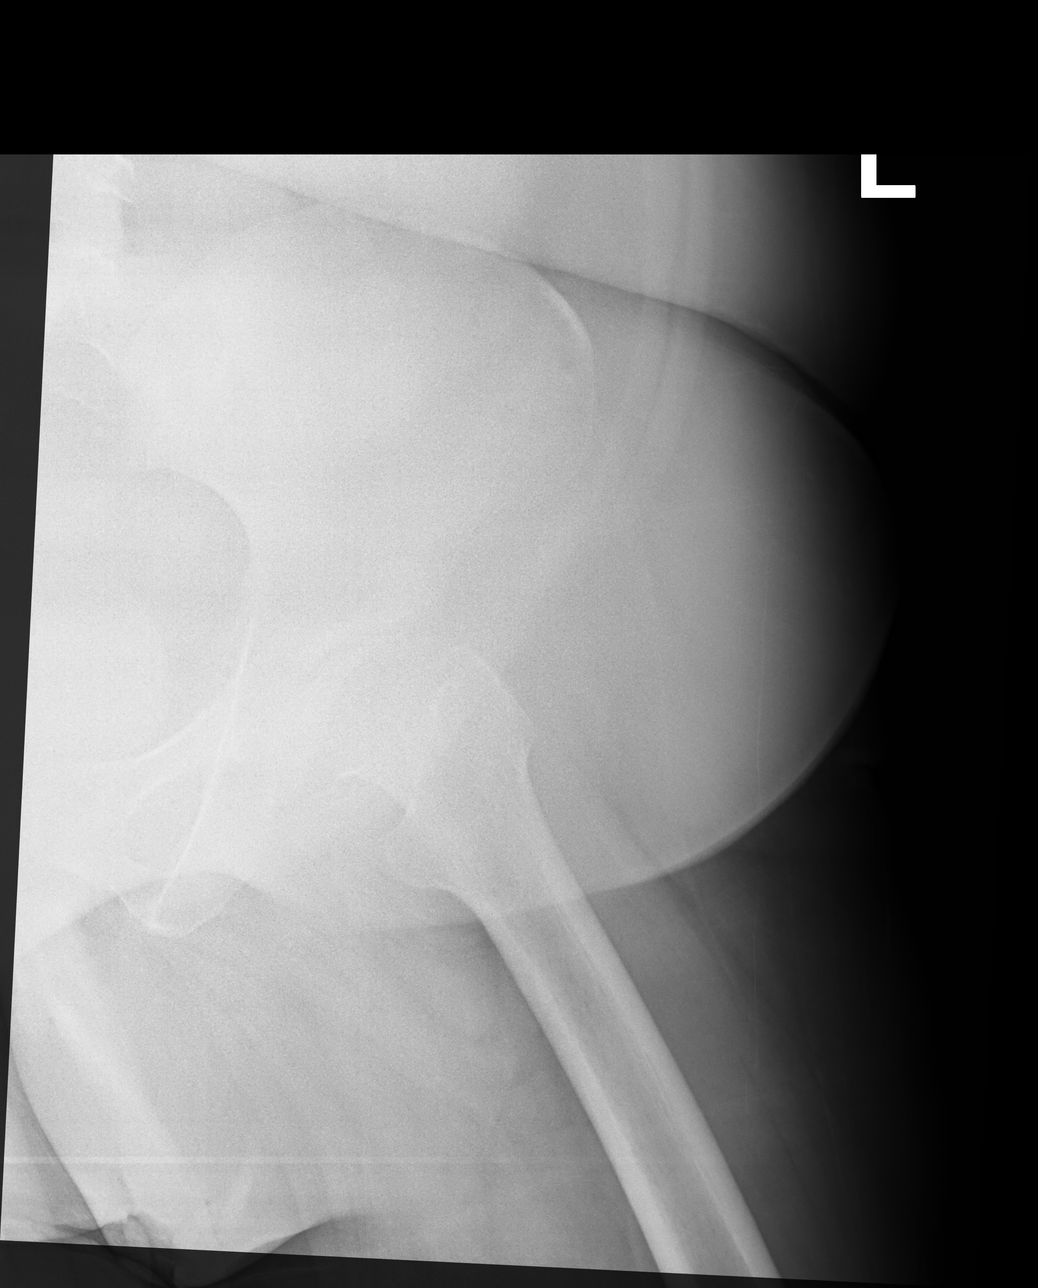

[3 of 3 positions shown; findings below may reference images not displayed]

FINDINGS: Evaluation is limited due to body habitus and soft tissue
attenuation. There is flattened appearance of the left femoral head,
likely chronic. This may be congenital or sequela avascular
necrosis. Evaluation is limited on CT. No definite acute fracture.
There is no dislocation. The soft tissues are unremarkable.
IMPRESSION: Chronic appearing flattened appearance of the left femoral head. CT
may provide better evaluation.

## 2023-10-27 ENCOUNTER — Other Ambulatory Visit: Payer: Self-pay | Admitting: Family

## 2023-10-27 DIAGNOSIS — E781 Pure hyperglyceridemia: Secondary | ICD-10-CM

## 2023-12-22 ENCOUNTER — Ambulatory Visit: Admitting: Internal Medicine

## 2023-12-22 ENCOUNTER — Encounter: Payer: Self-pay | Admitting: Internal Medicine

## 2023-12-22 ENCOUNTER — Ambulatory Visit (INDEPENDENT_AMBULATORY_CARE_PROVIDER_SITE_OTHER)
Admission: RE | Admit: 2023-12-22 | Discharge: 2023-12-22 | Disposition: A | Discharge status: Home / Self Care | Source: Ambulatory Visit | Attending: Internal Medicine | Admitting: Internal Medicine

## 2023-12-22 VITALS — BP 110/64 | HR 81 | Temp 98.3°F | Ht 64.5 in | Wt 319.0 lb

## 2023-12-22 DIAGNOSIS — M25571 Pain in right ankle and joints of right foot: Secondary | ICD-10-CM | POA: Diagnosis not present

## 2023-12-22 DIAGNOSIS — M7731 Calcaneal spur, right foot: Secondary | ICD-10-CM | POA: Diagnosis not present

## 2023-12-22 NOTE — Progress Notes (Signed)
 Subjective:    Patient ID: Christina Cobb, female    DOB: 01-24-1991, 33 y.o.   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 Trouble even getting shoe off  Current Outpatient Medications on File Prior to Visit  Medication Sig Dispense Refill   atorvastatin (LIPITOR) 20 MG tablet Take 1 tablet (20 mg total) by mouth daily. 90 tablet 3   clobetasol cream (TEMOVATE) 0.05 % Apply 1 Application topically 2 (two) times daily. 30 g 0   Continuous Glucose Sensor (DEXCOM G7 SENSOR) MISC APPLY SENSOR EVERY 10 DAYS TO MONITOR GLUCOSE CONTINUOUSLY 12 each 0   cyanocobalamin (VITAMIN B12) 1000 MCG tablet Take 1,000 mcg by mouth daily.     Dulaglutide (TRULICITY) 1.5 MG/0.5ML SOAJ INJECT 1.5 MG INTO THE SKIN ONCE A WEEK 4 mL 5   folic acid (FOLVITE) 1 MG tablet Take 1 tablet by mouth once daily 90 tablet 0   glipiZIDE (GLUCOTROL) 5 MG tablet TAKE 1 TABLET BY MOUTH TWICE DAILY BEFORE A MEAL 180 tablet 3   metFORMIN (GLUCOPHAGE) 1000 MG tablet Take 1 tablet (1,000 mg total) by mouth 2 (two) times daily with a meal. 180 tablet 3   omega-3 acid ethyl esters (LOVAZA) 1 g capsule Take 2 capsules by mouth twice daily 120 capsule 3   No current facility-administered medications on file prior to visit.    No Known Allergies  Past Medical History:  Diagnosis Date   GERD (gastroesophageal reflux disease)    Legg-Perthes disease     Past Surgical History:  Procedure Laterality Date   HIP SURGERY Left 1998   due to Legg Prethes Disease   TONSILLECTOMY AND ADENOIDECTOMY  1997    Family History  Adopted: Yes    Social History   Socioeconomic History    Marital status: Single    Spouse name: Not on file   Number of children: 0   Years of education: Not on file   Highest education level: Bachelor's degree (e.g., BA, AB, BS)  Occupational History   Occupation: [employer]  Tobacco Use   Smoking status: Never   Smokeless tobacco: Never  Vaping Use   Vaping status: Never Used  Substance and Sexual Activity   Alcohol use: No   Drug use: No   Sexual activity: Never  Other Topics Concern   Not on file  Social History Narrative   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    Social Drivers of Corporate investment banker Strain: Low Risk  (07/21/2023)   Overall Financial Resource Strain (CARDIA)    Difficulty of Paying Living Expenses: Not hard at all  Food Insecurity: No Food Insecurity (07/21/2023)   Hunger Vital Sign    Worried About Running Out of Food in the Last Year: Never true    Ran Out of Food in the Last Year: Never true  Transportation Needs: No Transportation Needs (07/21/2023)   PRAPARE - Administrator, Civil Service (Medical): No    Lack of Transportation (Non-Medical): No  Physical Activity: Unknown (07/21/2023)   Exercise Vital Sign    Days of Exercise per Week: 0 days  Minutes of Exercise per Session: Not on file  Stress: No Stress Concern Present (07/21/2023)   [employer] of Occupational Health - Occupational Stress Questionnaire    Feeling of Stress : Not at all  Social Connections: Socially Isolated (07/21/2023)   Social Connection and Isolation Panel [NHANES]    Frequency of Communication with Friends and Family: More than three times a week    Frequency of Social Gatherings with Friends and Family: Once a week    Attends Religious Services: Never    Database administrator or Organizations: No    Attends Engineer, structural: Not on file    Marital Status: Never married  Intimate Partner Violence: Not on file   Review of Systems     Objective:   Physical  Exam Constitutional:      Appearance: Normal appearance.  Musculoskeletal:     Comments: Swelling over lateral malleolus right ankle Bony tenderness there and pain with ROM Sig pain with any weight bearing--but is able to  Neurological:     Mental Status: She is alert.            Assessment & Plan:

## 2023-12-22 NOTE — Assessment & Plan Note (Addendum)
 Likely a sprain but will check x-ray  X-ray negative Discussed getting brace---will give one here Doesn't want crutches Aleve 2 bid for now Okay to return to work tomorrow---hopefully not too much walking

## 2023-12-30 ENCOUNTER — Other Ambulatory Visit: Payer: Self-pay | Admitting: Family

## 2023-12-30 DIAGNOSIS — E782 Mixed hyperlipidemia: Secondary | ICD-10-CM

## 2023-12-30 DIAGNOSIS — E1165 Type 2 diabetes mellitus with hyperglycemia: Secondary | ICD-10-CM

## 2024-01-14 ENCOUNTER — Ambulatory Visit: Payer: BC Managed Care – PPO | Admitting: Family

## 2024-01-15 ENCOUNTER — Ambulatory Visit: Admitting: Family

## 2024-01-15 VITALS — BP 124/72 | HR 69 | Temp 97.9°F | Ht 64.5 in | Wt 317.4 lb

## 2024-01-15 DIAGNOSIS — E781 Pure hyperglyceridemia: Secondary | ICD-10-CM

## 2024-01-15 DIAGNOSIS — E1165 Type 2 diabetes mellitus with hyperglycemia: Secondary | ICD-10-CM | POA: Diagnosis not present

## 2024-01-15 DIAGNOSIS — Z7985 Long-term (current) use of injectable non-insulin antidiabetic drugs: Secondary | ICD-10-CM | POA: Diagnosis not present

## 2024-01-15 DIAGNOSIS — E782 Mixed hyperlipidemia: Secondary | ICD-10-CM | POA: Diagnosis not present

## 2024-01-15 DIAGNOSIS — R7989 Other specified abnormal findings of blood chemistry: Secondary | ICD-10-CM

## 2024-01-15 DIAGNOSIS — E538 Deficiency of other specified B group vitamins: Secondary | ICD-10-CM

## 2024-01-15 DIAGNOSIS — E063 Autoimmune thyroiditis: Secondary | ICD-10-CM | POA: Diagnosis not present

## 2024-01-15 LAB — LIPID PANEL
Cholesterol: 124 mg/dL (ref 0–200)
HDL: 29 mg/dL — ABNORMAL LOW (ref >39.00)
LDL Cholesterol: 63 mg/dL (ref 0–99)
NonHDL: 94.81
Total CHOL/HDL Ratio: 4
Triglycerides: 158 mg/dL — ABNORMAL HIGH (ref 0.0–149.0)
VLDL: 31.6 mg/dL (ref 0.0–40.0)

## 2024-01-15 LAB — COMPREHENSIVE METABOLIC PANEL WITH GFR
ALT: 30 U/L (ref 0–35)
AST: 27 U/L (ref 0–37)
Albumin: 4.3 g/dL (ref 3.5–5.2)
Alkaline Phosphatase: 82 U/L (ref 39–117)
BUN: 10 mg/dL (ref 6–23)
CO2: 26 meq/L (ref 19–32)
Calcium: 8.7 mg/dL (ref 8.4–10.5)
Chloride: 106 meq/L (ref 96–112)
Creatinine, Ser: 0.69 mg/dL (ref 0.40–1.20)
GFR: 115.52 mL/min (ref >60.00)
Glucose, Bld: 106 mg/dL — ABNORMAL HIGH (ref 70–99)
Potassium: 4.4 meq/L (ref 3.5–5.1)
Sodium: 139 meq/L (ref 135–145)
Total Bilirubin: 0.4 mg/dL (ref 0.2–1.2)
Total Protein: 6.8 g/dL (ref 6.0–8.3)

## 2024-01-15 LAB — HEMOGLOBIN A1C: Hgb A1c MFr Bld: 6.4 % (ref 4.6–6.5)

## 2024-01-15 LAB — MICROALBUMIN / CREATININE URINE RATIO
Creatinine,U: 159 mg/dL
Microalb Creat Ratio: 5.2 mg/g (ref 0.0–30.0)
Microalb, Ur: 0.8 mg/dL (ref 0.0–1.9)

## 2024-01-15 LAB — B12 AND FOLATE PANEL
Folate: 6.9 ng/mL (ref >5.9)
Vitamin B-12: 224 pg/mL (ref 211–911)

## 2024-01-15 LAB — TSH: TSH: 2.03 u[IU]/mL (ref 0.35–5.50)

## 2024-01-15 MED ORDER — OZEMPIC (1 MG/DOSE) 2 MG/1.5ML ~~LOC~~ SOPN: 1.0000 mg | PEN_INJECTOR | SUBCUTANEOUS | 2 refills | Status: DC

## 2024-01-15 NOTE — Assessment & Plan Note (Signed)
 Ordered hga1c pending results. Work on diabetic diet and exercise as tolerated. Yearly foot exam done today in office.   Change to ozempic 1 mg  Finish up current RX trulicity  Urine m/a ordered today

## 2024-01-15 NOTE — Assessment & Plan Note (Signed)
 Repeat today pending results.

## 2024-01-15 NOTE — Assessment & Plan Note (Signed)
 Ordered lipid panel, pending results. Work on low cholesterol diet and exercise as tolerated

## 2024-01-15 NOTE — Progress Notes (Signed)
 Established Patient Office Visit  Subjective:      CC:  Chief Complaint  Patient presents with   Medical Management of Chronic Issues    HPI: Christina Cobb is a 33 y.o. female presenting on 01/15/2024 for Medical Management of Chronic Issues   DM2: diet, could be better. Exercise: 12000 steps a day at work but no regular scheduled exercise routine. Averages dexcom around 139. Taking trulicity 1.5 mg weekly, glipizide 5 mg bid, as well as metformin 1000 mg twice daily. She would like to change back to ozempic.  Lab Results  Component Value Date   HGBA1C 5.9 07/21/2023      HLD: on atorvastatin 20 mg nightly doing well and tolerating well. Diet could be better.     Wt Readings from Last 3 Encounters:  01/15/24 (!) 317 lb 6.4 oz (144 kg)  12/22/23 (!) 319 lb (144.7 kg)  07/21/23 (!) 311 lb 12.8 oz (141.4 kg)      Social history:  Relevant past medical, surgical, family and social history reviewed and updated as indicated. Interim medical history since our last visit reviewed.  Allergies and medications reviewed and updated.  DATA REVIEWED: CHART IN EPIC     ROS: Negative unless specifically indicated above in HPI.    Current Outpatient Medications:    atorvastatin (LIPITOR) 20 MG tablet, Take 1 tablet by mouth once daily, Disp: 90 tablet, Rfl: 0   clobetasol cream (TEMOVATE) 0.05 %, Apply 1 Application topically 2 (two) times daily., Disp: 30 g, Rfl: 0   Continuous Glucose Sensor (DEXCOM G7 SENSOR) MISC, APPLY SENSOR EVERY 10 DAYS TO MONITOR GLUCOSE CONTINUOUSLY, Disp: 12 each, Rfl: 0   cyanocobalamin (VITAMIN B12) 1000 MCG tablet, Take 1,000 mcg by mouth daily., Disp: , Rfl:    folic acid (FOLVITE) 1 MG tablet, Take 1 tablet by mouth once daily, Disp: 90 tablet, Rfl: 0   glipiZIDE (GLUCOTROL) 5 MG tablet, TAKE 1 TABLET BY MOUTH TWICE DAILY BEFORE A MEAL, Disp: 180 tablet, Rfl: 0   metFORMIN (GLUCOPHAGE) 1000 MG tablet, TAKE 1 TABLET BY MOUTH TWICE DAILY WITH  A MEAL, Disp: 180 tablet, Rfl: 0   omega-3 acid ethyl esters (LOVAZA) 1 g capsule, Take 2 capsules by mouth twice daily, Disp: 120 capsule, Rfl: 3   Semaglutide, 1 MG/DOSE, (OZEMPIC, 1 MG/DOSE,) 2 MG/1.5ML SOPN, Inject 1 mg into the skin once a week., Disp: 6 mL, Rfl: 2      Objective:    BP 124/72 (BP Location: Left Arm, Patient Position: Sitting, Cuff Size: Large)   Pulse 69   Temp 97.9 F (36.6 C) (Temporal)   Ht 5' 4.5" (1.638 m)   Wt (!) 317 lb 6.4 oz (144 kg)   LMP 12/09/2023 (Exact Date)   SpO2 98%   BMI 53.64 kg/m   Wt Readings from Last 3 Encounters:  01/15/24 (!) 317 lb 6.4 oz (144 kg)  12/22/23 (!) 319 lb (144.7 kg)  07/21/23 (!) 311 lb 12.8 oz (141.4 kg)    Physical Exam Constitutional:      General: She is not in acute distress.    Appearance: Normal appearance. She is normal weight. She is not ill-appearing, toxic-appearing or diaphoretic.  HENT:     Head: Normocephalic.  Cardiovascular:     Rate and Rhythm: Normal rate and regular rhythm.  Pulmonary:     Effort: Pulmonary effort is normal.  Musculoskeletal:        General: Normal range of motion.  Neurological:  General: No focal deficit present.     Mental Status: She is alert and oriented to person, place, and time. Mental status is at baseline.  Psychiatric:        Mood and Affect: Mood normal.        Behavior: Behavior normal.        Thought Content: Thought content normal.        Judgment: Judgment normal.           Assessment & Plan:  Type 2 diabetes mellitus with hyperglycemia, without long-term current use of insulin (HCC) Assessment & Plan: Ordered hga1c pending results. Work on diabetic diet and exercise as tolerated. Yearly foot exam done today in office.   Change to ozempic 1 mg  Finish up current RX trulicity  Urine m/a ordered today   Orders: -     Ozempic (1 MG/DOSE); Inject 1 mg into the skin once a week.  Dispense: 6 mL; Refill: 2 -     Comprehensive metabolic panel with  GFR -     Hemoglobin A1c -     Microalbumin / creatinine urine ratio  Hashimoto's thyroiditis -     TSH  Elevated LFTs Assessment & Plan: Repeat today pending results  Orders: -     Comprehensive metabolic panel with GFR  Hypertriglyceridemia -     Lipid panel  B12 deficiency Assessment & Plan: Ordered b12 pending results   Orders: -     B12 and Folate Panel  Folate deficiency -     B12 and Folate Panel  Mixed hyperlipidemia Assessment & Plan: Ordered lipid panel, pending results. Work on low cholesterol diet and exercise as tolerated       Return in about 6 months (around 07/16/2024) for f/u CPE.  Mort Sawyers, MSN, APRN, FNP-C [facility]

## 2024-01-15 NOTE — Assessment & Plan Note (Signed)
 Ordered b12 pending results

## 2024-01-16 ENCOUNTER — Encounter: Payer: Self-pay | Admitting: Family

## 2024-03-07 ENCOUNTER — Other Ambulatory Visit: Payer: Self-pay | Admitting: Family

## 2024-03-07 DIAGNOSIS — E1165 Type 2 diabetes mellitus with hyperglycemia: Secondary | ICD-10-CM

## 2024-03-07 DIAGNOSIS — E781 Pure hyperglyceridemia: Secondary | ICD-10-CM

## 2024-03-09 ENCOUNTER — Other Ambulatory Visit (HOSPITAL_COMMUNITY): Payer: Self-pay

## 2024-03-09 ENCOUNTER — Telehealth: Payer: Self-pay | Admitting: Pharmacy Technician

## 2024-03-09 NOTE — Telephone Encounter (Signed)
 Pharmacy Patient Advocate Encounter   Received notification from CoverMyMeds that prior authorization for Ozempic  (1 MG/DOSE) 4MG /3ML pen-injectorsis required/requested.   Insurance verification completed.   The patient is insured through [facility] .   Per test claim: PA required; PA submitted to above mentioned insurance via CoverMyMeds Key/confirmation #/EOC [id number] Status is pending

## 2024-03-10 ENCOUNTER — Other Ambulatory Visit (HOSPITAL_COMMUNITY): Payer: Self-pay

## 2024-03-10 NOTE — Telephone Encounter (Signed)
 Pharmacy Patient Advocate Encounter  Received notification from [facility] that Prior Authorization for Ozempic  (1 MG/DOSE) 4MG /3ML pen-injectors has been APPROVED from 03/09/2024 to 03/09/2025. Ran test claim, Copay is $24.99. This test claim was processed through [facility]- copay amounts may vary at other pharmacies due to pharmacy/plan contracts, or as the patient moves through the different stages of their insurance plan.   PA #/Case ID/Reference #: [id number]

## 2024-03-16 ENCOUNTER — Other Ambulatory Visit (HOSPITAL_COMMUNITY): Payer: Self-pay

## 2024-03-16 ENCOUNTER — Telehealth: Payer: Self-pay

## 2024-03-16 NOTE — Telephone Encounter (Signed)
 Pharmacy Patient Advocate Encounter   Received notification from CoverMyMeds that prior authorization for Omega-3-acid  Ethyl Esters 1GM capsules is required/requested.   Insurance verification completed.   The patient is insured through [facility] .   Per test insurance: Product not covered/ Plan exclusion

## 2024-04-30 ENCOUNTER — Other Ambulatory Visit: Payer: Self-pay

## 2024-04-30 ENCOUNTER — Telehealth: Payer: Self-pay | Admitting: Family

## 2024-04-30 DIAGNOSIS — E1165 Type 2 diabetes mellitus with hyperglycemia: Secondary | ICD-10-CM

## 2024-04-30 DIAGNOSIS — E781 Pure hyperglyceridemia: Secondary | ICD-10-CM

## 2024-04-30 DIAGNOSIS — E782 Mixed hyperlipidemia: Secondary | ICD-10-CM

## 2024-04-30 NOTE — Telephone Encounter (Signed)
 Spoke with pt's mother. States Lovaza  is not covered by the pt's drug formulary. A PA will not help. Vascepa 2g BID would be a $0 copay for the pt. Is this appropriate to change to? Thank you.

## 2024-05-02 MED ORDER — ICOSAPENT ETHYL 1 G PO CAPS: 2.0000 g | ORAL_CAPSULE | Freq: Two times a day (BID) | ORAL | 0 refills | Status: DC

## 2024-05-02 NOTE — Telephone Encounter (Signed)
 Sent the rx for vascepa 

## 2024-05-02 NOTE — Addendum Note (Signed)
 Addended by: CORWIN ANTU on: 05/02/2024 06:04 PM   Modules accepted: Orders

## 2024-05-03 NOTE — Telephone Encounter (Signed)
 Pt's mother is aware

## 2024-05-05 ENCOUNTER — Other Ambulatory Visit (HOSPITAL_COMMUNITY): Payer: Self-pay

## 2024-06-17 ENCOUNTER — Encounter: Payer: Self-pay | Admitting: Family

## 2024-06-17 DIAGNOSIS — Z3201 Encounter for pregnancy test, result positive: Secondary | ICD-10-CM

## 2024-06-18 ENCOUNTER — Ambulatory Visit

## 2024-06-18 ENCOUNTER — Ambulatory Visit
Admission: RE | Admit: 2024-06-18 | Discharge: 2024-06-18 | Disposition: A | Discharge status: Home / Self Care | Source: Ambulatory Visit | Attending: Emergency Medicine | Admitting: Emergency Medicine

## 2024-06-18 VITALS — BP 126/83 | HR 61 | Temp 98.5°F | Resp 15 | Ht 64.0 in | Wt 317.5 lb

## 2024-06-18 DIAGNOSIS — Z3201 Encounter for pregnancy test, result positive: Secondary | ICD-10-CM

## 2024-06-18 LAB — PREGNANCY, URINE: Preg Test, Ur: POSITIVE — AB

## 2024-06-18 NOTE — Discharge Instructions (Addendum)
 Your pregnancy test was positive(congratulations) Take daily over the counter prenatal vitamin Please call and get prenatal appt with OB/GYN

## 2024-06-18 NOTE — ED Triage Notes (Signed)
 Patient states that she took 5 home pregnancy tests yesterday and all were positive.  Patient is here to get documentation that she is pregnant in order to get an OB appointment.  Patient states that she is having some nausea.  Patient has started prenatal vitamins.

## 2024-06-18 NOTE — ED Provider Notes (Signed)
 [facility]    CSN: [id number] Arrival date & time: 06/18/24  1430      History   Chief Complaint Chief Complaint  Patient presents with   Possible Pregnancy    HPI Christina Cobb is a 33 y.o. female.   33 year old female, Christina Cobb, presents to urgent care for pregnancy test, patient states she took 5 home pregnancy tests yesterday and they were positive.  LMP 05/11/24 every 28 days per pt report  The history is provided by the patient. No language interpreter was used.    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 Authorizing Provider  atorvastatin  (LIPITOR) 20 MG tablet Take 1 tablet by mouth once daily 12/31/23   Dugal, Tabitha, FNP  clobetasol  cream (TEMOVATE ) 0.05 % Apply 1 Application topically 2 (two) times daily. 07/21/23   Corwin Antu,  FNP  Continuous Glucose Sensor (DEXCOM G7 SENSOR) MISC APPLY SENSOR EVERY 10 DAYS TO MONITOR GLUCOSE CONTINUOUSLY 05/19/23   Corwin Antu, FNP  cyanocobalamin  (VITAMIN B12) 1000 MCG tablet Take 1,000 mcg by mouth daily.    [provider]  folic acid  (FOLVITE ) 1 MG tablet Take 1 tablet by mouth once daily 12/02/22   Dugal, Tabitha, FNP  glipiZIDE  (GLUCOTROL ) 5 MG tablet TAKE 1 TABLET BY MOUTH TWICE DAILY BEFORE A MEAL 12/31/23   Corwin Antu, FNP  icosapent  Ethyl (VASCEPA ) 1 g capsule Take 2 capsules (2 g total) by mouth 2 (two) times daily. 05/02/24 07/31/24  Dugal, Tabitha, FNP  metFORMIN  (GLUCOPHAGE ) 1000 MG tablet TAKE 1 TABLET BY MOUTH TWICE DAILY WITH A MEAL 03/08/24   Corwin Antu, FNP  Semaglutide , 1 MG/DOSE, (OZEMPIC , 1 MG/DOSE,) 2 MG/1.5ML SOPN Inject 1 mg into the skin once a week. 01/15/24   Corwin Antu, FNP    Family History Family History  Adopted: Yes    Social History Social History   Tobacco Use   Smoking status: Never   Smokeless tobacco: Never  Vaping Use   Vaping status: Never Used  Substance Use Topics   Alcohol use: No   Drug use:  No     Allergies   Patient has no known allergies.   Review of Systems Review of Systems  Gastrointestinal:  Positive for nausea. Negative for vomiting.  Genitourinary:  Negative for vaginal bleeding.  Musculoskeletal:  Negative for back pain.  All other systems reviewed and are negative.    Physical Exam Triage Vital Signs ED Triage Vitals  Encounter Vitals Group     BP 06/18/24 1448 126/83     Girls Systolic BP Percentile --      Girls Diastolic BP Percentile --      Boys Systolic BP Percentile --      Boys Diastolic BP Percentile --      Pulse Rate 06/18/24 1448 61     Resp 06/18/24 1448 15     Temp 06/18/24 1448 98.5 F (36.9 C)     Temp Source 06/18/24 1448 Oral     SpO2 06/18/24 1448 97 %     Weight 06/18/24 1447 (!) 317 lb 7.4 oz (144 kg)     Height 06/18/24 1447 5' 4 (1.626 m)     Head  Circumference --      Peak Flow --      Pain Score --      Pain Loc --      Pain Education --      Exclude from Growth Chart --    No data found.  Updated Vital Signs BP 126/83 (BP Location: Right Arm)   Pulse 61   Temp 98.5 F (36.9 C) (Oral)   Resp 15   Ht 5' 4 (1.626 m)   Wt (!) 317 lb 7.4 oz (144 kg)   LMP 05/11/2024 (Approximate)   SpO2 97%   BMI 54.49 kg/m   Visual Acuity Right Eye Distance:   Left Eye Distance:   Bilateral Distance:    Right Eye Near:   Left Eye Near:    Bilateral Near:     Physical Exam Vitals and nursing note reviewed.  Constitutional:      Appearance: She is well-developed and well-groomed.  Cardiovascular:     Rate and Rhythm: Normal rate.  Pulmonary:     Effort: Pulmonary effort is normal.  Neurological:     General: No focal deficit present.     Mental Status: She is alert and oriented to person, place, and time.     GCS: GCS eye subscore is 4. GCS verbal subscore is 5. GCS motor subscore is 6.  Psychiatric:        Attention and Perception: Attention normal.        Mood and Affect: Mood normal.        Speech: Speech normal.        Behavior: Behavior normal. Behavior is cooperative.      UC Treatments / Results  Labs (all labs ordered are listed, but only abnormal results are displayed) Labs Reviewed  PREGNANCY, URINE - Abnormal; Notable for the following components:      Result Value   Preg Test, Ur POSITIVE (*)    All other components within normal limits    EKG   Radiology No results found.  Procedures Procedures (including critical care time)  Medications Ordered in UC Medications - No data to display  Initial Impression / Assessment and Plan / UC Course  I have reviewed the triage vital signs and the nursing notes.  Pertinent labs & imaging results that were available during my care of the patient were reviewed by me and  considered in my medical decision making (see chart for details).  Clinical Course  as of 06/18/24 1616  Fri Jun 18, 2024  1513 Urine preg test is positive for pregnancy [JD]    Clinical Course User Index [JD] Raziah Funnell, Rilla, NP   Discussed exam findings and plan of care with patient, strict go to ER precautions given.   Patient verbalized understanding to this provider.  Ddx: Positive urine pregnancy test Final Clinical Impressions(s) / UC Diagnoses   Final diagnoses:  Positive urine pregnancy test     Discharge Instructions      Your pregnancy test was positive(congratulations) Take daily over the counter prenatal vitamin Please call and get prenatal appt with OB/GYN     ED Prescriptions   None    PDMP not reviewed this encounter.   Aminta Rilla, NP 06/18/24 1616

## 2024-06-21 ENCOUNTER — Telehealth: Payer: Self-pay

## 2024-06-21 ENCOUNTER — Encounter: Payer: Self-pay | Admitting: Pharmacist

## 2024-06-21 NOTE — Telephone Encounter (Signed)
 Thanks Manuelita. I have advised her to do so.  I am going to put in a referral to you maybe you could discuss until she sees OB?  If you prefer now we can hold off on starting and I'll get her seen with either endo or gyn if able but might be hard to get her in.  Ps she already has dexcom I do believe I am also going to have her stop the vascepa  since it is unknown category

## 2024-06-21 NOTE — Addendum Note (Signed)
 Addended by: CORWIN ANTU on: 06/21/2024 03:24 PM   Modules accepted: Orders

## 2024-06-21 NOTE — Telephone Encounter (Signed)
 Would you mind weighing in for clarification? Metformin , glipizide , ozempic  are category C in pregnancy and atorvastatin  is category D.  She is recently found to have a positive pregnancy test. I imagine we have to stop all of those above abruply and start insulin?

## 2024-06-21 NOTE — Progress Notes (Unsigned)
 Care Guide Pharmacy Note  06/21/2024 Name: Christina Cobb MRN: [id number] DOB: 1991/02/10  Referred By: Corwin Antu, FNP Reason for referral: Complex Care Management and Call Attempt #1 (Unsuccessful initial outreach to schedule with PHARM D- Manuelita)   Christina Cobb is a 33 y.o. year old female who is a primary care patient of Corwin Antu, FNP.  Christina Cobb was referred to the pharmacist for assistance related to: DMII  An unsuccessful telephone outreach was attempted today to contact the patient who was referred to the pharmacy team for assistance with Disease management. Additional attempts will be made to contact the patient.  Leotis Rase [facility], [facility] Guide  Direct Dial: [phone]  Fax [phone]

## 2024-06-21 NOTE — Progress Notes (Signed)
 Chart Review Reason: Drug information Question - diabetes management positive pregnancy test  Summary: Statin should be discontinued.  Metformin  and other oral/injectable therapies are not recommended as first-line therapy for T2DM in pregnancy. Metformin  crosses the placenta and has shown fetal exposure equal to or greater than that of maternal exposure. Other agents have limited data in pregnancy and are not routinely recommended.   Insulin remains the preferred method of management of T2D in pregnancy.  Considerations: Test claim at [facility]: Dexcom G7: $0  Tresiba: Non-preferred Lantus covered at $35 for 1 month, or $35 for 3 month supply Humalog preferred at $35 for 1 or 3 month supply   Consider 64-month supply of insulin(s) as cost appears unchanged.   Manuelita FABIENE Kobs, PharmD Clinical Pharmacist [facility] [phone]

## 2024-06-22 NOTE — Progress Notes (Signed)
 NOTED

## 2024-06-22 NOTE — Progress Notes (Signed)
 07/16/2024 Name: Christina Cobb MRN: [id number] DOB: 11-05-1990  Subjective  Chief Complaint  Patient presents with   Diabetes    Reason for visit: ?  Christina Cobb is a 33 y.o. female with a history of diabetes (type 2), who presents today for an initial diabetes pharmacotherapy visit in the setting of recent psitive pregnancy test.? Pertinent PMH also includes hashimoto's thyroiditis, obesity, HLD.  Known DM Complications: no known complications   Care Team: Primary Care Provider: Corwin Antu, FNP   Recent Summary of Change: Positive pregnancy test, has stopped all DM medications  Medication Access/Adherence: Prescription drug coverage: Payor: BLUE CROSS BLUE SHIELD / Plan: BCBS COMM PPO / Product Type: *No Product type* / .  - Reports that all medications are affordable. Dexcom $0. Trulicity  <$30.  Since Last visit / History of Present Illness: ?  Patient reports doing well. Has recently stopped chronic medications as advised by her PCP 2/2 several positive pregnancy tests with sugars remaining stable/slightly elevated on Dexcom.  Confirms initial OBGYN visit still scheduled for 07/02/24. Has not been contacted at this time by Endocrinology.  Current schedule: 12 hour days at work (alternating weeks between nigh vs day shift).  Typically has a meal before and after work, snacks throughout the day at work though does not have a full meal (crackers, chips). 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 Can pack a lunch. Confirms she is allowed to have a personal bag with medications/insulin, foods, etc. stored in her locker. Can keep items on her person while at work (e.g. glucose tabs, snacks).  Denies issues with self-injection. Did well with Trulicity . Previously assisted her father with prandial insulin injections (Humalog) regularly.   Reported DM Regimen: ?  None   DM medications tried in the past:?  Trulicity , metformin , glipizide  (held after positive pregnancy  test)  SMBG: Dexcom G7 ?(successfully connected with clarity) May have an old glucometer at home, though unsure where or if it works.      Hypo/Hyperglycemia: ?  Symptoms of hypoglycemia since last visit:? no  Symptoms of hyperglycemia since last visit:? no   Reported Diet: Patient typically eats 2 meals per day.  Breakfast: Before work Lunch: N/A -- snacks at work [employer]: after work  DM Prevention:  Statin: Held in the setting of +pregnancy test History of chronic kidney disease? no History of albuminuria? no, last UACR on 01/15/24 = 5.2 mg/g ACE/ARB - No (not appropriate during pregnancy)  Last eye exam: Unclear Last foot exam: No foot exam found Tobacco Use: Never smoker  Immunizations:? Flu: Due (Last: 07/21/2023); Pneumococcal: No Record - DUE;  Covid-19 (Eligible if desired in the setting of pregnancy)  Cardiovascular Risk Reduction History of clinical ASCVD? no History of heart failure? no History of hyperlipidemia? yes Current BMI: 54.49 kg/m2 (Ht 64 in, Wt 144 kg) Taking statin? Taking prior; Held in the setting of pregnancy Taking aspirin? not indicated; Not taking   Taking SGLT-2i? no Taking GLP- 1 RA? no - held in the setting of pregnancy (previously on Ozempic )      _______________________________________________  Objective    Review of Systems:? Limited in the setting of virtual visit Constitutional:? No fever, chills or unintentional weight loss  Endocrine:? No polyuria, polyphagia or blurred vision    Physical Examination:  Vitals:  Wt Readings from Last 3 Encounters:  07/06/24 298 lb (135.2 kg)  07/12/24 298 lb (135.2 kg)  06/18/24 (!) 317 lb 7.4 oz (144 kg)   BP Readings  from Last 3 Encounters:  07/06/24 115/66  07/12/24 115/66  06/18/24 126/83   Pulse Readings from Last 3 Encounters:  07/06/24 90  07/12/24 90  06/18/24 61     Labs:?  Lab Results  Component Value Date   HGBA1C 6.3 (H) 07/06/2024   HGBA1C 6.4 01/15/2024   HGBA1C 5.9  07/21/2023   GLUCOSE 165 (H) 07/06/2024   MICRALBCREAT 5.2 01/15/2024   CREATININE 0.73 07/06/2024   CREATININE 0.69 01/15/2024   CREATININE 0.61 07/21/2023   GFR 115.52 01/15/2024   GFR 119.41 07/21/2023   GFR 119.85 01/14/2023    Lab Results  Component Value Date   CHOL 124 01/15/2024   LDLCALC 63 01/15/2024   LDLCALC 69 07/21/2023   LDLCALC 73 04/15/2023   LDLDIRECT 99.0 01/14/2023   LDLDIRECT 98.0 08/28/2022   HDL 29.00 (L) 01/15/2024   TRIG 158.0 (H) 01/15/2024   TRIG 82.0 07/21/2023   TRIG 140.0 04/15/2023   ALT 21 07/06/2024   ALT 30 01/15/2024   AST 17 07/06/2024   AST 27 01/15/2024      Chemistry      Component Value Date/Time   NA 137 07/06/2024 1439   K 4.4 07/06/2024 1439   CL 103 07/06/2024 1439   CO2 19 (L) 07/06/2024 1439   BUN 8 07/06/2024 1439   CREATININE 0.73 07/06/2024 1439      Component Value Date/Time   CALCIUM  9.1 07/06/2024 1439   ALKPHOS 87 07/06/2024 1439   AST 17 07/06/2024 1439   ALT 21 07/06/2024 1439   BILITOT 0.4 07/06/2024 1439       The ASCVD Risk score (Arnett DK, et al., 2019) failed to calculate for the following reasons:   The 2019 ASCVD risk score is only valid for ages 42 to 36  Assessment and Plan:   1. Diabetes, type 2 c/b pregnancy: controlled per last A1c of 6.4% (01/15/24), though has recently stopped DM2 therapies given positive pregnancy test. Fasting sugars remain very stable, though elevated slightly above goal. Irregular work/meal patterns. Some days with notable hyperglycemia post-prandially, self-corrects within 1-3 hours. Other days with less-concerning PP peaks. No prior use of insulin, though has personal experience with assisting family members with prandial injections (vial/syringe). None of the currently available human insulin preparations have been demonstrated to cross the placenta.  Has referral to endocrinology and upcoming visit with OBGYN (tomorrow). Diabetes management will be deferred to MFM/Endo  once established though unsure if will be discussed at initial OB visit or not.   Defer insulin start to tomorrow s/p OB visit though will send all prescriptions asap so she has access to all supplies/medications needed. Both glargine and lispro have been sufficiently studied in RTCs and are safe and effective in pregnancy.  Glucometer / testing supplies as back up to CGM Urine Ketones strips Pen needles Humalog Semglee (glargine) - 5 units once daily, current FBG 120s-130s in recent days (last week 140s+) Insurance Preferred insulins: Humalog, Semglee (90 ds same price as 30 ds)   2. Diabetes Education: Reviewed T2DM goals in pregnancy including differences in goals in non-pregnant women with T2.  Blood sugar goals during pregnancy   Fasting blood sugar 70 - 95 mg/dL   1 hour after eating 889 - 140 mg/dL  2 hour after eating 899 - 120 mg/dL  Discussed differences between basal and bolus insulin types.  Reviewed s/sx/tx hypoglycemia including preferred initial treatment with fast-acting sugar as opposed to complex carbs (3-4 glucose tab or 1 apple juice  box).    3. Healthcare Maintenance: Discuss with OBGYN Pneumococcal - Current status: Due given hx diabetes  Influenza - Current status: Due for 2025-2026 vaccine Covid 2025-2026: Eligible to receive given pregnancy dx.     Follow Up Follow up with clinical pharmacist tomorrow s/p OBGYN visit/plan Patient given direct line for questions regarding medication therapy  Future Appointments  Date Time Provider Department Center  07/21/2024  2:00 PM [facility] US  1 [facility] None  08/02/2024  9:55 AM Jayne Harlene CROME, CNM [facility] None    Manuelita FABIENE Kobs, PharmD Clinical Pharmacist [facility] [phone]

## 2024-06-22 NOTE — Patient Instructions (Signed)
 Ms. Christina Cobb,   It was a pleasure to speak with you today! As we discussed:?   Treatment The preferred treatment of all types of diabetes in pregnancy is insulin. Insulin is safe for the baby, and the most effective medicine to efficiently control blood sugars.  Your endocrinologist or maternal fetal medicine doctor will work closely with you through your pregnancy to ensure your blood sugars are at a healthy range for the baby.  Note, your insulin needs may change throughout pregnancy as insulin resistance naturally increases later in pregnancy. This means you will work closely with your providers to adjust insulin doses as needed.   You will receive pens rather than glass vials/syringes.  We discussed watching videos online to learn more about insulin pens and how to use them. We also are always available in clinic for training as needed.  Pens are stored in fridge until opened. Once opened, may remain at room temperature for the month.   Long-acting insulin: Brand Semglee, generic name glargine Given usually once daily. This insulin works just a little bit over a ~24 hour period. This can be given any time of day that is most convenient (though preferably the same time each day - ex every night or every morning).  One clue as to how well this insulin dose is working is by checking your fasting blood sugars first thing when you wake up.   Rapid-acting insulin: Brand Humalog, generic name lispro Given ONLY with meals. This insulin works very fast, within 15 minutes to lower blood sugars. Ideally, it is given 15 minutes before your meal, though can be given at the time of your meal if easier with your schedule.  We can see how well your Humalog dose is working based on your sugar readings in the hours after you've had a meal.   Blood sugar Goals Blood sugar goals during pregnancy are a bit stricter than in normal type 2 diabetes when you are not pregnant. This is because high blood sugar  can have negative impacts on a growing baby.  Your A1c goal will depend on several factors, though is similarly usually lower than our usual goal of <7% (commonly less than 6% depending on your discussion with your endocrinologist, and may change depending how far along you are).  Blood sugar goals during pregnancy   Fasting blood sugar 70 - 95 mg/dL   1 hour after eating 889 - 140 mg/dL  2 hour after eating 899 - 120 mg/dL    How to treat low blood sugar:  - For blood sugar less than 70: Treat with 4 ounces of juice (such as one small apple juice juicebox), or with 3 to 4 glucose tablets.  - Re-check blood sugar in 15 minutes. ?  If blood sugar is still less than 70 on re-check, treat again and re-check in 15 minutes - Once sugar is above 70, you may have a snack with protein (e.g. peanutbutter crackers, cheese, protein shake, etc. Which should keep your sugar more stable and prevent it from falling again)  *I recommend keeping glucose tablets on you while at work, especially since you are having less regular meals during your shifts.    Consider discussing the following with your OBGYN: Pneumococcal vaccine Influenza vaccine 2025-2026 Covid 2025-2026: Eligible to receive given pregnancy diagnosis     Please reach out prior to your next scheduled appointment should you have any questions or concerns.  With ANY questions/concerns/thoughts/blood sugar questions, etc. You may  respond directly to this message, or leave me a voicemail at [phone] and I will get back to you shortly.   Thank you!   Future Appointments  Date Time Provider Department Center  07/01/2024  9:00 AM [facility] PHARMACIST [facility] [address]  07/02/2024  2:15 PM [facility] INTAKE NURSE [facility] None  07/21/2024  2:00 PM [facility] US  1 [facility] None  08/02/2024  9:55 AM Jayne Harlene CROME, CNM [facility] None   Christina Cobb, PharmD Clinical Pharmacist [facility] [phone]

## 2024-06-22 NOTE — Progress Notes (Signed)
 Care Guide Pharmacy Note  06/22/2024 Name: Christina Cobb MRN: [id number] DOB: December 24, 1990  Referred By: Corwin Antu, FNP Reason for referral: Complex Care Management, Call Attempt #1 (Unsuccessful initial outreach to schedule with PHARM DGLENWOOD Shaver), and Call Attempt #2 (Successful initial outreach scheduled with Shaver GOWER D)   Christina Cobb is a 32 y.o. year old female who is a primary care patient of Corwin Antu, FNP.  Christina Cobb was referred to the pharmacist for assistance related to: DMII  Successful contact was made with the patient to discuss pharmacy services including being ready for the pharmacist to call at least 5 minutes before the scheduled appointment time and to have medication bottles and any blood pressure readings ready for review. The patient agreed to meet with the pharmacist via telephone visit on (date/time). 07/01/24 @ 9 AM.   [name], [facility] Guide  Direct Dial: [phone]  Fax [phone]

## 2024-06-27 ENCOUNTER — Other Ambulatory Visit: Payer: Self-pay | Admitting: Family

## 2024-06-27 DIAGNOSIS — E1165 Type 2 diabetes mellitus with hyperglycemia: Secondary | ICD-10-CM

## 2024-07-01 ENCOUNTER — Other Ambulatory Visit

## 2024-07-01 DIAGNOSIS — Z3201 Encounter for pregnancy test, result positive: Secondary | ICD-10-CM

## 2024-07-01 DIAGNOSIS — E1165 Type 2 diabetes mellitus with hyperglycemia: Secondary | ICD-10-CM

## 2024-07-02 ENCOUNTER — Encounter: Payer: Self-pay | Admitting: Pharmacist

## 2024-07-02 ENCOUNTER — Ambulatory Visit

## 2024-07-02 VITALS — BP 115/66 | HR 90 | Ht 64.0 in | Wt 298.0 lb

## 2024-07-02 DIAGNOSIS — Z3689 Encounter for other specified antenatal screening: Secondary | ICD-10-CM

## 2024-07-02 DIAGNOSIS — O0990 Supervision of high risk pregnancy, unspecified, unspecified trimester: Secondary | ICD-10-CM | POA: Insufficient documentation

## 2024-07-02 NOTE — Patient Instructions (Signed)
 First Trimester of Pregnancy  The first trimester of pregnancy starts on the first day of your last monthly period until the end of week 13. This is months 1 through 3 of pregnancy. A week after a sperm fertilizes an egg, the egg will implant into the wall of the uterus and begin to develop into a baby. Body changes during your first trimester Your body goes through many changes during pregnancy. The changes usually return to normal after your baby is born. Physical changes Your breasts may grow larger and may hurt. The area around your nipples may get darker. Your periods will stop. Your hair and nails may grow faster. You may pee more often. Health changes You may tire easily. Your gums may bleed and may be sensitive when you brush and floss. You may not feel hungry. You may have heartburn. You may throw up or feel like you may throw up. You may want to eat some foods, but not others. You may have headaches. You may have trouble pooping (constipation). Other changes Your emotions may change from day to day. You may have more dreams. Follow these instructions at home: Medicines Talk to your health care provider if you're taking medicines. Ask if the medicines are safe to take during pregnancy. Your provider may change the medicines that you take. Do not take any medicines unless told to by your provider. Take a prenatal vitamin that has at least 600 micrograms (mcg) of folic acid. Do not use herbal medicines, illegal substances, or medicines that are not approved by your provider. Eating and drinking While you're pregnant your body needs extra food for your growing baby. Talk with your provider about what to eat while pregnant. Activity Most women are able to exercise during pregnancy. Exercises may need to change as your pregnancy goes on. Talk to your provider about your activities and exercise routines. Relieving pain and discomfort Wear a good, supportive bra if your breasts  hurt. Rest with your legs raised if you have leg cramps or low back pain. Safety Wear your seatbelt at all times when you're in a car. Talk to your provider if someone hits you, hurts you, or yells at you. Talk with your provider if you're feeling sad or have thoughts of hurting yourself. Lifestyle Certain things can be harmful while you're pregnant. Follow these rules: Do not use hot tubs, steam rooms, or saunas. Do not douche. Do not use tampons or scented pads. Do not drink alcohol,smoke, vape, or use products with nicotine or tobacco in them. If you need help quitting, talk with your provider. Avoid cat litter boxes and soil used by cats. These things carry germs that can cause harm to your pregnancy and your baby. General instructions Keep all follow-up visits. It helps you and your unborn baby stay as healthy as possible. Write down your questions. Take them to your visits. Your provider will: Talk with you about your overall health. Give you advice or refer you to specialists who can help with different needs, including: Prenatal education classes. Mental health and counseling. Foods and healthy eating. Ask for help if you need help with food. Call your dentist and ask to be seen. Brush your teeth with a soft toothbrush. Floss gently. Where to find more information American Pregnancy Association: americanpregnancy.org [employer]necologists: acog.org Office on [employer] Health: TravelLesson.ca Contact a health care provider if: You feel dizzy, faint, or have a fever. You vomit or have watery poop (diarrhea) for 2  days or more. You have abnormal discharge or bleeding from your vagina. You have pain when you pee or your pee smells bad. You have cramps, pain, or pressure in your belly area. Get help right away if: You have trouble breathing or chest pain. You have any kind of injury, such as from a fall or a car crash. These symptoms may be an  emergency. Get help right away. Call 911. Do not wait to see if the symptoms will go away. Do not drive yourself to the hospital. This information is not intended to replace advice given to you by your health care provider. Make sure you discuss any questions you have with your health care provider. Document Revised: 06/26/2023 Document Reviewed: 01/24/2023 Elsevier Patient Education  2024 Elsevier Inc.   Common Medications Safe in Pregnancy  Acne:      Constipation:  Benzoyl Peroxide     Colace  Clindamycin      Dulcolax Suppository  Topica Erythromycin     Fibercon  Salicylic Acid      Metamucil         Miralax AVOID:        Senakot   Accutane    Cough:  Retin-A       Cough Drops  Tetracycline      Phenergan w/ Codeine if Rx  Minocycline      Robitussin (Plain & DM)  Antibiotics:     Crabs/Lice:  Ceclor       RID  Cephalosporins    AVOID:  E-Mycins      Kwell  Keflex  Macrobid/Macrodantin   Diarrhea:  Penicillin      Kao-Pectate  Zithromax      Imodium AD         PUSH FLUIDS AVOID:       Cipro     Fever:  Tetracycline      Tylenol (Regular or Extra  Minocycline       Strength)  Levaquin      Extra Strength-Do not          Exceed 8 tabs/24 hrs Caffeine:        200mg /day (equiv. To 1 cup of coffee or  approx. 3 12 oz sodas)         Gas: Cold/Hayfever:       Gas-X  Benadryl      Mylicon  Claritin       Phazyme  **Claritin-D        Chlor-Trimeton    Headaches:  Dimetapp      ASA-Free Excedrin  Drixoral-Non-Drowsy     Cold Compress  Mucinex (Guaifenasin)     Tylenol (Regular or Extra  Sudafed/Sudafed-12 Hour     Strength)  **Sudafed PE Pseudoephedrine   Tylenol Cold & Sinus     Vicks Vapor Rub  Zyrtec  **AVOID if Problems With Blood Pressure         Heartburn: Avoid lying down for at least 1 hour after meals  Aciphex      Maalox     Rash:  Milk of Magnesia     Benadryl    Mylanta       1% Hydrocortisone Cream  Pepcid  Pepcid Complete   Sleep  Aids:  Prevacid      Ambien   Prilosec       Benadryl  Rolaids       Chamomile Tea  Tums (Limit 4/day)     Unisom  Tylenol PM         Warm milk-add vanilla or  Hemorrhoids:       Sugar for taste  Anusol/Anusol H.C.  (RX: Analapram 2.5%)  Sugar Substitutes:  Hydrocortisone OTC     Ok in moderation  Preparation H      Tucks        Vaseline lotion applied to tissue with wiping    Herpes:     Throat:  Acyclovir      Oragel  Famvir  Valtrex     Vaccines:         Flu Shot Leg Cramps:       *Gardasil  Benadryl      Hepatitis A         Hepatitis B Nasal Spray:       Pneumovax  Saline Nasal Spray     Polio Booster         Tetanus Nausea:       Tuberculosis test or PPD  Vitamin B6 25 mg TID   AVOID:    Dramamine      *Gardasil  Emetrol       Live Poliovirus  Ginger Root 250 mg QID    MMR (measles, mumps &  High Complex Carbs @ Bedtime    rebella)  Sea Bands-Accupressure    Varicella (Chickenpox)  Unisom 1/2 tab TID     *No known complications           If received before Pain:         Known pregnancy;   Darvocet       Resume series after  Lortab        Delivery  Percocet    Yeast:   Tramadol      Femstat  Tylenol 3      Gyne-lotrimin  Ultram       Monistat  Vicodin           MISC:         All Sunscreens           Hair Coloring/highlights          Insect Repellant's          (Including DEET)         Mystic Tans   Commonly Asked Questions During Pregnancy   Cats: A parasite can be excreted in cat feces.  To avoid exposure you need to have another person empty the little box.  If you must empty the litter box you will need to wear gloves.  Wash your hands after handling your cat.  This parasite can also be found in raw or undercooked meat so this should also be avoided.  Colds, Sore Throats, Flu: Please check your medication sheet to see what you can take for symptoms.  If your symptoms are unrelieved by these medications please call the office.  Dental Work: Most  any dental work Agricultural consultant recommends is permitted.  X-rays should only be taken during the first trimester if absolutely necessary.  Your abdomen should be shielded with a lead apron during all x-rays.  Please notify your provider prior to receiving any x-rays.  Novocaine is fine; gas is not recommended.  If your dentist requires a note from [place] prior to dental work please call the office and we will provide one for you.  Exercise: Exercise is an important part of staying healthy during your pregnancy.  You may continue most exercises you were accustomed to prior to pregnancy.  Later in your pregnancy you will most likely notice you have difficulty with activities requiring balance like riding a bicycle.  It is important that you listen to your body and avoid activities that put you at a higher risk of falling.  Adequate rest and staying well hydrated are a must!  If you have questions about the safety of specific activities ask your provider.    Exposure to Children with illness: Try to avoid obvious exposure; report any symptoms to [place] when noted,  If you have chicken pos, red measles or mumps, you should be immune to these diseases.   Please do not take any vaccines while pregnant unless you have checked with your OB provider.  Fetal Movement: After 28 weeks we recommend you do "kick counts" twice daily.  Lie or sit down in a calm quiet environment and count your baby movements "kicks".  You should feel your baby at least 10 times per hour.  If you have not felt 10 kicks within the first hour get up, walk around and have something sweet to eat or drink then repeat for an additional hour.  If count remains less than 10 per hour notify your provider.  Fumigating: Follow your pest control agent's advice as to how long to stay out of your home.  Ventilate the area well before re-entering.  Hemorrhoids:   Most over-the-counter preparations can be used during pregnancy.  Check your medication to see what is  safe to use.  It is important to use a stool softener or fiber in your diet and to drink lots of liquids.  If hemorrhoids seem to be getting worse please call the office.   Hot Tubs:  Hot tubs Jacuzzis and saunas are not recommended while pregnant.  These increase your internal body temperature and should be avoided.  Intercourse:  Sexual intercourse is safe during pregnancy as long as you are comfortable, unless otherwise advised by your provider.  Spotting may occur after intercourse; report any bright red bleeding that is heavier than spotting.  Labor:  If you know that you are in labor, please go to the hospital.  If you are unsure, please call the office and let us help you decide what to do.  Lifting, straining, etc:  If your job requires heavy lifting or straining please check with your provider for any limitations.  Generally, you should not lift items heavier than that you can lift simply with your hands and arms (no back muscles)  Painting:  Paint fumes do not harm your pregnancy, but may make you ill and should be avoided if possible.  Latex or water based paints have less odor than oils.  Use adequate ventilation while painting.  Permanents & Hair Color:  Chemicals in hair dyes are not recommended as they cause increase hair dryness which can increase hair loss during pregnancy.  " Highlighting" and permanents are allowed.  Dye may be absorbed differently and permanents may not hold as well during pregnancy.  Sunbathing:  Use a sunscreen, as skin burns easily during pregnancy.  Drink plenty of fluids; avoid over heating.  Tanning Beds:  Because their possible side effects are still unknown, tanning beds are not recommended.  Ultrasound Scans:  Routine ultrasounds are performed at approximately 20 weeks.  You will be able to see your baby's general anatomy an if you would like to know the gender this can usually be determined as well.  If it is questionable when you conceived you may  also  receive an ultrasound early in your pregnancy for dating purposes.  Otherwise ultrasound exams are not routinely performed unless there is a medical necessity.  Although you can request a scan we ask that you pay for it when conducted because insurance does not cover " patient request" scans.  Work: If your pregnancy proceeds without complications you may work until your due date, unless your physician or employer advises otherwise.  Round Ligament Pain/Pelvic Discomfort:  Sharp, shooting pains not associated with bleeding are fairly common, usually occurring in the second trimester of pregnancy.  They tend to be worse when standing up or when you remain standing for long periods of time.  These are the result of pressure of certain pelvic ligaments called "round ligaments".  Rest, Tylenol and heat seem to be the most effective relief.  As the womb and fetus grow, they rise out of the pelvis and the discomfort improves.  Please notify the office if your pain seems different than that described.  It may represent a more serious condition.

## 2024-07-02 NOTE — Progress Notes (Signed)
 New OB Intake  I explained I am completing New OB Intake today. We discussed her EDD of 02/16/24 that is based on LMP of 05/11/24. Pt is G1/P0. I reviewed her allergies, medications, Medical/Surgical/OB history, and appropriate screenings. There are cats in the home: yes. If yes: Indoor. Based on history, this is a/an pregnancy complicated by Type 2 Diabetes . Her obstetrical history is significant for obesity.  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 Also was told to stop all Diabetic medications by PCP.   Delivery Plans:  Plans to deliver at [facility].  Anatomy US  Explained first scheduled US  will be 07/21/24. Anatomy US  will be scheduled around [redacted] weeks gestational age.  Labs Discussed genetic screening with patient. Patient desires genetic testing to be drawn at new OB visit. Discussed possible labs to be drawn at new OB appointment.   Social Determinants of Health Food Insecurity: denies food insecurity Transportation: Patient denies transportation needs. Childcare: Discussed no children allowed at ultrasound appointments.   First visit review I reviewed new OB appt with pt. I explained she will have  blood work and pap smear/pelvic exam if indicated. Explained pt will be seen by Harlene Cisco, CNM at first visit; encounter routed to appropriate provider.   Mathis LITTIE Getting, CMA 07/02/2024  3:09 PM

## 2024-07-02 NOTE — Progress Notes (Deleted)
 New OB Intake  I connected with  LENNYX VERDELL on 07/02/24 at  2:15 PM EDT by {Contact:24193} Video Visit and verified that I am speaking with the correct person using two identifiers. Nurse is located at [facility] and pt is located at [employer]  I discussed the limitations, risks, security and privacy concerns of performing an evaluation and management service by telephone and the availability of in person appointments. I also discussed with the patient that there may be a patient responsible charge related to this service. The patient expressed understanding and agreed to proceed.  I explained I am completing New OB Intake today. We discussed her EDD of *** that is based on LMP of ***. Pt is G***/P***. I reviewed her allergies, medications, Medical/Surgical/OB history, and appropriate screenings. There are cats in the home: yes. If yes: Indoor. Based on history, this is a/an pregnancy complicated by Type 2 Diabetic and obesity  . Her obstetrical history is significant for obesity and Diabetes.  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  Anatomy US  Explained first scheduled US  will be ***. Anatomy US  will be scheduled around [redacted] weeks gestational age.  Labs Discussed genetic screening with patient. Patient *** genetic  testing to be drawn at new OB visit. Discussed possible labs to be drawn at new OB appointment.  COVID Vaccine Patient {HAS/HAS NOT:20194} had COVID vaccine.   Social Determinants of Health Food Insecurity: {qnni7:72521} WIC Referral: Patient {ACTION; IS/IS WNU:[id number]} interested in referral to [facility].  Transportation: {transportation:25542} Childcare: Discussed no children allowed at ultrasound appointments.   First visit review I reviewed new OB appt with pt. I explained she will have blood work and pap smear/pelvic exam if indicated. Explained pt will be seen by *** at first visit; encounter routed to appropriate provider.   Mathis LITTIE Getting, CMA 07/02/2024  2:08 PM

## 2024-07-06 ENCOUNTER — Encounter: Payer: Self-pay | Admitting: Certified Nurse Midwife

## 2024-07-06 ENCOUNTER — Telehealth: Payer: Self-pay

## 2024-07-06 ENCOUNTER — Ambulatory Visit (INDEPENDENT_AMBULATORY_CARE_PROVIDER_SITE_OTHER): Admitting: Obstetrics & Gynecology

## 2024-07-06 VITALS — BP 115/66 | HR 90 | Wt 298.0 lb

## 2024-07-06 DIAGNOSIS — E063 Autoimmune thyroiditis: Secondary | ICD-10-CM

## 2024-07-06 DIAGNOSIS — Z3A08 8 weeks gestation of pregnancy: Secondary | ICD-10-CM

## 2024-07-06 DIAGNOSIS — E1165 Type 2 diabetes mellitus with hyperglycemia: Secondary | ICD-10-CM

## 2024-07-06 DIAGNOSIS — O0990 Supervision of high risk pregnancy, unspecified, unspecified trimester: Secondary | ICD-10-CM

## 2024-07-06 DIAGNOSIS — Z5321 Procedure and treatment not carried out due to patient leaving prior to being seen by health care provider: Secondary | ICD-10-CM | POA: Diagnosis not present

## 2024-07-06 DIAGNOSIS — O24111 Pre-existing diabetes mellitus, type 2, in pregnancy, first trimester: Secondary | ICD-10-CM

## 2024-07-06 DIAGNOSIS — O99211 Obesity complicating pregnancy, first trimester: Secondary | ICD-10-CM

## 2024-07-06 DIAGNOSIS — O99281 Endocrine, nutritional and metabolic diseases complicating pregnancy, first trimester: Secondary | ICD-10-CM | POA: Diagnosis not present

## 2024-07-06 DIAGNOSIS — O208 Other hemorrhage in early pregnancy: Secondary | ICD-10-CM | POA: Diagnosis not present

## 2024-07-06 DIAGNOSIS — O99210 Obesity complicating pregnancy, unspecified trimester: Secondary | ICD-10-CM | POA: Insufficient documentation

## 2024-07-06 DIAGNOSIS — E669 Obesity, unspecified: Secondary | ICD-10-CM

## 2024-07-06 MED ORDER — PEN NEEDLES 32G X 4 MM MISC: 4 refills | Status: AC

## 2024-07-06 MED ORDER — LANCETS MISC. MISC: 6 refills | Status: AC

## 2024-07-06 MED ORDER — BLOOD GLUCOSE TEST VI STRP: ORAL_STRIP | 6 refills | Status: AC

## 2024-07-06 MED ORDER — INSULIN LISPRO (1 UNIT DIAL) 100 UNIT/ML (KWIKPEN): PEN_INJECTOR | SUBCUTANEOUS | 0 refills | Status: AC

## 2024-07-06 MED ORDER — BLOOD GLUCOSE MONITORING SUPPL DEVI: 0 refills | Status: AC

## 2024-07-06 MED ORDER — INSULIN GLARGINE-YFGN 100 UNIT/ML ~~LOC~~ SOPN: PEN_INJECTOR | SUBCUTANEOUS | 0 refills | Status: AC

## 2024-07-06 MED ORDER — ACETONE (URINE) TEST VI STRP: ORAL_STRIP | 2 refills | Status: AC

## 2024-07-06 NOTE — Telephone Encounter (Signed)
 Looks like OB is referring her to endo for high risk pregnancy with DM. I assume we can just start insulin and then have them take over.

## 2024-07-06 NOTE — Telephone Encounter (Signed)
 Received fax from pharmacy [facility]. Semglee is on back order. Would like to know if they can use lantus or Basaglar

## 2024-07-06 NOTE — Progress Notes (Signed)
    GYNECOLOGY PROGRESS NOTE  Subjective:    Patient ID: Christina Cobb, female    DOB: 01-Sep-1991, 33 y.o.   MRN: [id number]  HPI  Patient is a 33 y.o. monogamous G1P0 at [redacted] weeks EGA based LMP. It was a normal period. She was not trying to conceive. She was not using contraception. She started taking PNVs about 3 weeks ago.  She was diagnosed with Type 2 DM 12/2021, was borderline for years prior to this. She was prescribed metformin  in 2023. She stopped this about a month ago when she found out that was pregnant. She did a phone visit with a pharmacist at [facility] in [place] and and she prescribed insulin. She has not picked up the insulin as of yet.  She has a Dexcom but tells me that she only has access to the last 24 hours of her values. I was able to see the values through her Mychart message. Overall, on average over the last 14 days was about 170. Her values change greatly during the day.  She has never used an insulin pump but her she is familiar with it as her father used it. She will be on day shift for the next 6 weeks but usually works a swing shift (2 weeks of nights followed by 2 weeks off).  The following portions of the patient's history were reviewed and updated as appropriate: allergies, current medications, past family history, past medical history, past social history, past surgical history, and problem list.  Review of Systems Pertinent items are noted in HPI.  She works [employer], swing shift.    Objective:   Blood pressure 115/66, pulse 90, weight 298 lb (135.2 kg), last menstrual period 05/11/2024. Body mass index is 51.15 kg/m. Well nourished, well hydrated White female, no apparent distress She is ambulating and conversing normally.    Assessment:   1. Hashimoto's thyroiditis   2. Supervision of high risk pregnancy, antepartum   3. Type 2 diabetes mellitus with hyperglycemia, without long-term current use of insulin  (HCC)   4. Obesity in pregnancy, antepartum, unspecified trimester      Plan:   1. Hashimoto's thyroiditis (Primary) - check TSH q trimester  2. Supervision of high risk pregnancy, antepartum - dating ultrasound scheduled in 2 weeks  3. Type 2 diabetes mellitus with hyperglycemia, without long-term current use of insulin (HCC) -refer to endocrinology and MFM for management of her DM  4. Obesity in pregnancy, antepartum, unspecified trimester - get baseline labs today. - discussed the increased risk of stillbirth with weight gain of more than 15 pounds

## 2024-07-06 NOTE — Telephone Encounter (Signed)
 Dr. Starla,   You just saw our mutual pt today for consult for new pregnancy.  She is a diabetic (as you know) and we stopped her medications due to medication class with pregnancy. We were discussing starting on insulin however I am not sure how it works with high risk MFM. Should I just wait for her to consult with them or should we start insulin now?

## 2024-07-06 NOTE — Telephone Encounter (Signed)
 On backorder I'm assuming others would be ok?

## 2024-07-07 DIAGNOSIS — O24111 Pre-existing diabetes mellitus, type 2, in pregnancy, first trimester: Secondary | ICD-10-CM | POA: Diagnosis not present

## 2024-07-07 DIAGNOSIS — O23591 Infection of other part of genital tract in pregnancy, first trimester: Secondary | ICD-10-CM | POA: Diagnosis not present

## 2024-07-07 DIAGNOSIS — Z3A08 8 weeks gestation of pregnancy: Secondary | ICD-10-CM | POA: Diagnosis not present

## 2024-07-07 DIAGNOSIS — O26891 Other specified pregnancy related conditions, first trimester: Secondary | ICD-10-CM | POA: Diagnosis not present

## 2024-07-07 DIAGNOSIS — O209 Hemorrhage in early pregnancy, unspecified: Secondary | ICD-10-CM | POA: Diagnosis not present

## 2024-07-07 DIAGNOSIS — Z3A01 Less than 8 weeks gestation of pregnancy: Secondary | ICD-10-CM | POA: Diagnosis not present

## 2024-07-07 DIAGNOSIS — R8271 Bacteriuria: Secondary | ICD-10-CM | POA: Diagnosis not present

## 2024-07-07 DIAGNOSIS — E119 Type 2 diabetes mellitus without complications: Secondary | ICD-10-CM | POA: Diagnosis not present

## 2024-07-07 DIAGNOSIS — B9689 Other specified bacterial agents as the cause of diseases classified elsewhere: Secondary | ICD-10-CM | POA: Diagnosis not present

## 2024-07-07 LAB — COMPREHENSIVE METABOLIC PANEL WITH GFR
ALT: 21 IU/L (ref 0–32)
AST: 17 IU/L (ref 0–40)
Albumin: 4.1 g/dL (ref 3.9–4.9)
Alkaline Phosphatase: 87 IU/L (ref 41–116)
BUN/Creatinine Ratio: 11 (ref 9–23)
BUN: 8 mg/dL (ref 6–20)
Bilirubin Total: 0.4 mg/dL (ref 0.0–1.2)
CO2: 19 mmol/L — ABNORMAL LOW (ref 20–29)
Calcium: 9.1 mg/dL (ref 8.7–10.2)
Chloride: 103 mmol/L (ref 96–106)
Creatinine, Ser: 0.73 mg/dL (ref 0.57–1.00)
Globulin, Total: 2.3 g/dL (ref 1.5–4.5)
Glucose: 165 mg/dL — ABNORMAL HIGH (ref 70–99)
Potassium: 4.4 mmol/L (ref 3.5–5.2)
Sodium: 137 mmol/L (ref 134–144)
Total Protein: 6.4 g/dL (ref 6.0–8.5)
eGFR: 112 mL/min/1.73 (ref >59)

## 2024-07-07 LAB — PROTEIN / CREATININE RATIO, URINE
Creatinine, Urine: 148.1 mg/dL
Protein, Ur: 7.4 mg/dL
Protein/Creat Ratio: 50 mg/g{creat} (ref 0–200)

## 2024-07-07 LAB — HEMOGLOBIN A1C
Est. average glucose Bld gHb Est-mCnc: 134 mg/dL
Hgb A1c MFr Bld: 6.3 % — ABNORMAL HIGH (ref 4.8–5.6)

## 2024-07-09 ENCOUNTER — Other Ambulatory Visit: Payer: Self-pay

## 2024-07-09 ENCOUNTER — Other Ambulatory Visit (HOSPITAL_COMMUNITY): Payer: Self-pay

## 2024-07-09 MED ORDER — INSULIN GLARGINE-YFGN 100 UNIT/ML ~~LOC~~ SOPN
5.0000 [IU] | PEN_INJECTOR | Freq: Every day | SUBCUTANEOUS | 0 refills | Status: AC
  Filled 2024-07-09: qty 3, 20d supply, fill #0

## 2024-07-11 ENCOUNTER — Encounter: Payer: Self-pay | Admitting: Certified Nurse Midwife

## 2024-07-12 NOTE — Telephone Encounter (Signed)
 Can you ask pt to reach out to her Gynecologist to see if they are going to take over diabetic therapy?   We d/c her medications for diabetes and would like for her to start on insulin but prefer that it is managed by her gynecologist. Please have her get in touch with them asap.   ------------------------------------ Christina Cobb,   I never heard back. I am going to have pt reach out to her gynecologist because at this time I feel they should be managing.

## 2024-07-12 NOTE — Telephone Encounter (Signed)
 LM for pt to return call.

## 2024-07-14 DIAGNOSIS — O209 Hemorrhage in early pregnancy, unspecified: Secondary | ICD-10-CM | POA: Diagnosis not present

## 2024-07-14 DIAGNOSIS — Z3A01 Less than 8 weeks gestation of pregnancy: Secondary | ICD-10-CM | POA: Diagnosis not present

## 2024-07-14 NOTE — Telephone Encounter (Signed)
 Incidentally saw message within inbox that was sent this am by MA for pt to consider going to OB. This should have been triaged as this was an urgent matter,and it was not sent to me. Called pt once upon reading at 5:55 pm and spoke with pt who stated she just arrived at the ER. She is stable but has still been bleeding and now she is bleeding bright red blood. She is at [facility] ER.

## 2024-07-16 ENCOUNTER — Other Ambulatory Visit: Payer: Self-pay | Admitting: Family

## 2024-07-16 DIAGNOSIS — E1165 Type 2 diabetes mellitus with hyperglycemia: Secondary | ICD-10-CM

## 2024-07-21 ENCOUNTER — Other Ambulatory Visit

## 2024-07-21 DIAGNOSIS — O209 Hemorrhage in early pregnancy, unspecified: Secondary | ICD-10-CM | POA: Diagnosis not present

## 2024-07-21 DIAGNOSIS — Z3689 Encounter for other specified antenatal screening: Secondary | ICD-10-CM | POA: Diagnosis not present

## 2024-07-21 DIAGNOSIS — Z8639 Personal history of other endocrine, nutritional and metabolic disease: Secondary | ICD-10-CM | POA: Diagnosis not present

## 2024-07-21 DIAGNOSIS — O99211 Obesity complicating pregnancy, first trimester: Secondary | ICD-10-CM | POA: Diagnosis not present

## 2024-07-21 DIAGNOSIS — E669 Obesity, unspecified: Secondary | ICD-10-CM | POA: Diagnosis not present

## 2024-07-21 DIAGNOSIS — O3680X0 Pregnancy with inconclusive fetal viability, not applicable or unspecified: Secondary | ICD-10-CM | POA: Diagnosis not present

## 2024-07-21 DIAGNOSIS — O24111 Pre-existing diabetes mellitus, type 2, in pregnancy, first trimester: Secondary | ICD-10-CM | POA: Diagnosis not present

## 2024-07-21 DIAGNOSIS — Z3A08 8 weeks gestation of pregnancy: Secondary | ICD-10-CM | POA: Diagnosis not present

## 2024-07-21 DIAGNOSIS — O0991 Supervision of high risk pregnancy, unspecified, first trimester: Secondary | ICD-10-CM | POA: Diagnosis not present

## 2024-08-02 ENCOUNTER — Encounter: Admitting: Certified Nurse Midwife

## 2024-08-04 DIAGNOSIS — Z31430 Encounter of female for testing for genetic disease carrier status for procreative management: Secondary | ICD-10-CM | POA: Diagnosis not present

## 2024-08-04 DIAGNOSIS — Z369 Encounter for antenatal screening, unspecified: Secondary | ICD-10-CM | POA: Diagnosis not present

## 2024-08-04 DIAGNOSIS — Z315 Encounter for genetic counseling: Secondary | ICD-10-CM | POA: Diagnosis not present

## 2024-08-10 DIAGNOSIS — Z01419 Encounter for gynecological examination (general) (routine) without abnormal findings: Secondary | ICD-10-CM | POA: Diagnosis not present

## 2024-08-10 DIAGNOSIS — O212 Late vomiting of pregnancy: Secondary | ICD-10-CM | POA: Diagnosis not present

## 2024-08-10 DIAGNOSIS — B1910 Unspecified viral hepatitis B without hepatic coma: Secondary | ICD-10-CM | POA: Diagnosis not present

## 2024-08-10 DIAGNOSIS — O24111 Pre-existing diabetes mellitus, type 2, in pregnancy, first trimester: Secondary | ICD-10-CM | POA: Diagnosis not present

## 2024-08-10 DIAGNOSIS — O26891 Other specified pregnancy related conditions, first trimester: Secondary | ICD-10-CM | POA: Diagnosis not present

## 2024-08-10 DIAGNOSIS — O24311 Unspecified pre-existing diabetes mellitus in pregnancy, first trimester: Secondary | ICD-10-CM | POA: Diagnosis not present

## 2024-08-10 DIAGNOSIS — O0991 Supervision of high risk pregnancy, unspecified, first trimester: Secondary | ICD-10-CM | POA: Diagnosis not present

## 2024-08-10 DIAGNOSIS — Z7982 Long term (current) use of aspirin: Secondary | ICD-10-CM | POA: Diagnosis not present

## 2024-08-10 DIAGNOSIS — O98411 Viral hepatitis complicating pregnancy, first trimester: Secondary | ICD-10-CM | POA: Diagnosis not present

## 2024-08-10 DIAGNOSIS — F909 Attention-deficit hyperactivity disorder, unspecified type: Secondary | ICD-10-CM | POA: Diagnosis not present

## 2024-08-10 DIAGNOSIS — O99211 Obesity complicating pregnancy, first trimester: Secondary | ICD-10-CM | POA: Diagnosis not present

## 2024-08-10 DIAGNOSIS — Z1151 Encounter for screening for human papillomavirus (HPV): Secondary | ICD-10-CM | POA: Diagnosis not present

## 2024-08-10 DIAGNOSIS — Z113 Encounter for screening for infections with a predominantly sexual mode of transmission: Secondary | ICD-10-CM | POA: Diagnosis not present

## 2024-08-10 DIAGNOSIS — Z6791 Unspecified blood type, Rh negative: Secondary | ICD-10-CM | POA: Diagnosis not present

## 2024-08-10 DIAGNOSIS — F901 Attention-deficit hyperactivity disorder, predominantly hyperactive type: Secondary | ICD-10-CM | POA: Diagnosis not present

## 2024-08-10 DIAGNOSIS — O99210 Obesity complicating pregnancy, unspecified trimester: Secondary | ICD-10-CM | POA: Diagnosis not present

## 2024-08-15 ENCOUNTER — Other Ambulatory Visit: Payer: Self-pay | Admitting: Family

## 2024-08-15 DIAGNOSIS — E1165 Type 2 diabetes mellitus with hyperglycemia: Secondary | ICD-10-CM

## 2024-08-19 DIAGNOSIS — O99212 Obesity complicating pregnancy, second trimester: Secondary | ICD-10-CM | POA: Diagnosis not present

## 2024-08-19 DIAGNOSIS — Z3A12 12 weeks gestation of pregnancy: Secondary | ICD-10-CM | POA: Diagnosis not present

## 2024-08-19 DIAGNOSIS — E669 Obesity, unspecified: Secondary | ICD-10-CM | POA: Diagnosis not present

## 2024-08-19 DIAGNOSIS — Z6841 Body Mass Index (BMI) 40.0 and over, adult: Secondary | ICD-10-CM | POA: Diagnosis not present

## 2024-08-19 DIAGNOSIS — Z8639 Personal history of other endocrine, nutritional and metabolic disease: Secondary | ICD-10-CM | POA: Diagnosis not present

## 2024-08-19 DIAGNOSIS — Z3A14 14 weeks gestation of pregnancy: Secondary | ICD-10-CM | POA: Diagnosis not present

## 2024-08-19 DIAGNOSIS — O0992 Supervision of high risk pregnancy, unspecified, second trimester: Secondary | ICD-10-CM | POA: Diagnosis not present

## 2024-08-19 DIAGNOSIS — O0991 Supervision of high risk pregnancy, unspecified, first trimester: Secondary | ICD-10-CM | POA: Diagnosis not present

## 2024-08-20 DIAGNOSIS — I517 Cardiomegaly: Secondary | ICD-10-CM | POA: Diagnosis not present

## 2024-08-20 DIAGNOSIS — Z6841 Body Mass Index (BMI) 40.0 and over, adult: Secondary | ICD-10-CM | POA: Diagnosis not present

## 2024-09-14 ENCOUNTER — Other Ambulatory Visit: Payer: Self-pay | Admitting: Family

## 2024-09-14 DIAGNOSIS — E1165 Type 2 diabetes mellitus with hyperglycemia: Secondary | ICD-10-CM

## 2024-09-21 DIAGNOSIS — Z8616 Personal history of COVID-19: Secondary | ICD-10-CM | POA: Diagnosis not present

## 2024-09-21 DIAGNOSIS — O98412 Viral hepatitis complicating pregnancy, second trimester: Secondary | ICD-10-CM | POA: Diagnosis not present

## 2024-09-21 DIAGNOSIS — Z7982 Long term (current) use of aspirin: Secondary | ICD-10-CM | POA: Diagnosis not present

## 2024-09-21 DIAGNOSIS — O99342 Other mental disorders complicating pregnancy, second trimester: Secondary | ICD-10-CM | POA: Diagnosis not present

## 2024-09-21 DIAGNOSIS — O99012 Anemia complicating pregnancy, second trimester: Secondary | ICD-10-CM | POA: Diagnosis not present

## 2024-09-21 DIAGNOSIS — O24112 Pre-existing diabetes mellitus, type 2, in pregnancy, second trimester: Secondary | ICD-10-CM | POA: Diagnosis not present

## 2024-09-21 DIAGNOSIS — B1910 Unspecified viral hepatitis B without hepatic coma: Secondary | ICD-10-CM | POA: Diagnosis not present

## 2024-09-21 DIAGNOSIS — O24119 Pre-existing diabetes mellitus, type 2, in pregnancy, unspecified trimester: Secondary | ICD-10-CM | POA: Diagnosis not present

## 2024-09-21 DIAGNOSIS — O162 Unspecified maternal hypertension, second trimester: Secondary | ICD-10-CM | POA: Diagnosis not present

## 2024-09-21 DIAGNOSIS — Z79899 Other long term (current) drug therapy: Secondary | ICD-10-CM | POA: Diagnosis not present

## 2024-09-21 DIAGNOSIS — F909 Attention-deficit hyperactivity disorder, unspecified type: Secondary | ICD-10-CM | POA: Diagnosis not present

## 2024-09-21 DIAGNOSIS — E538 Deficiency of other specified B group vitamins: Secondary | ICD-10-CM | POA: Diagnosis not present

## 2024-09-21 DIAGNOSIS — O219 Vomiting of pregnancy, unspecified: Secondary | ICD-10-CM | POA: Diagnosis not present

## 2024-09-27 DIAGNOSIS — O24112 Pre-existing diabetes mellitus, type 2, in pregnancy, second trimester: Secondary | ICD-10-CM | POA: Diagnosis not present

## 2024-10-01 DIAGNOSIS — E669 Obesity, unspecified: Secondary | ICD-10-CM | POA: Diagnosis not present

## 2024-10-01 DIAGNOSIS — O99012 Anemia complicating pregnancy, second trimester: Secondary | ICD-10-CM | POA: Diagnosis not present

## 2024-10-01 DIAGNOSIS — Z8639 Personal history of other endocrine, nutritional and metabolic disease: Secondary | ICD-10-CM | POA: Diagnosis not present

## 2024-10-01 DIAGNOSIS — Z6841 Body Mass Index (BMI) 40.0 and over, adult: Secondary | ICD-10-CM | POA: Diagnosis not present

## 2024-10-01 DIAGNOSIS — O0992 Supervision of high risk pregnancy, unspecified, second trimester: Secondary | ICD-10-CM | POA: Diagnosis not present

## 2024-10-01 DIAGNOSIS — O4442 Low lying placenta NOS or without hemorrhage, second trimester: Secondary | ICD-10-CM | POA: Diagnosis not present

## 2024-10-01 DIAGNOSIS — O4440 Low lying placenta NOS or without hemorrhage, unspecified trimester: Secondary | ICD-10-CM | POA: Diagnosis not present

## 2024-10-01 DIAGNOSIS — Z3A19 19 weeks gestation of pregnancy: Secondary | ICD-10-CM | POA: Diagnosis not present

## 2024-10-01 DIAGNOSIS — O0991 Supervision of high risk pregnancy, unspecified, first trimester: Secondary | ICD-10-CM | POA: Diagnosis not present

## 2024-10-01 DIAGNOSIS — O99212 Obesity complicating pregnancy, second trimester: Secondary | ICD-10-CM | POA: Diagnosis not present

## 2024-10-01 DIAGNOSIS — Z3A20 20 weeks gestation of pregnancy: Secondary | ICD-10-CM | POA: Diagnosis not present
